# Patient Record
Sex: Female | Born: 1980 | Race: Black or African American | Hispanic: No | Marital: Single | Smoking: Former smoker
Health system: Southern US, Community
[De-identification: ages and names within clinical notes are randomized; demographics above are authoritative.]

## PROBLEM LIST (undated history)

## (undated) ENCOUNTER — Inpatient Hospital Stay (HOSPITAL_COMMUNITY): Payer: Self-pay

## (undated) DIAGNOSIS — T7421XA Adult sexual abuse, confirmed, initial encounter: Secondary | ICD-10-CM

## (undated) DIAGNOSIS — K219 Gastro-esophageal reflux disease without esophagitis: Secondary | ICD-10-CM

## (undated) DIAGNOSIS — M797 Fibromyalgia: Secondary | ICD-10-CM

## (undated) HISTORY — PX: WISDOM TOOTH EXTRACTION: SHX21

## (undated) HISTORY — DX: Adult sexual abuse, confirmed, initial encounter: T74.21XA

---

## 2005-08-12 ENCOUNTER — Emergency Department (HOSPITAL_COMMUNITY): Admission: EM | Admit: 2005-08-12 | Discharge: 2005-08-12 | Payer: Self-pay | Admitting: Emergency Medicine

## 2005-11-17 ENCOUNTER — Emergency Department (HOSPITAL_COMMUNITY): Admission: EM | Admit: 2005-11-17 | Discharge: 2005-11-17 | Payer: Self-pay | Admitting: Family Medicine

## 2009-09-04 ENCOUNTER — Inpatient Hospital Stay (HOSPITAL_COMMUNITY): Admission: AD | Admit: 2009-09-04 | Discharge: 2009-09-04 | Payer: Self-pay | Admitting: Obstetrics and Gynecology

## 2009-09-12 ENCOUNTER — Inpatient Hospital Stay (HOSPITAL_COMMUNITY): Admission: AD | Admit: 2009-09-12 | Discharge: 2009-09-13 | Payer: Self-pay | Admitting: Family Medicine

## 2009-10-03 LAB — CYTOLOGY - PAP: Pap: NEGATIVE

## 2009-12-30 ENCOUNTER — Inpatient Hospital Stay (HOSPITAL_COMMUNITY): Admission: AD | Admit: 2009-12-30 | Discharge: 2009-12-30 | Payer: Self-pay | Admitting: Obstetrics and Gynecology

## 2010-02-01 DIAGNOSIS — IMO0002 Reserved for concepts with insufficient information to code with codable children: Secondary | ICD-10-CM

## 2010-11-23 LAB — URINALYSIS, ROUTINE W REFLEX MICROSCOPIC
Bilirubin Urine: NEGATIVE
Glucose, UA: NEGATIVE mg/dL
Hgb urine dipstick: NEGATIVE
Ketones, ur: 15 mg/dL — AB
Nitrite: NEGATIVE
Protein, ur: NEGATIVE mg/dL
Specific Gravity, Urine: >1.03 — ABNORMAL HIGH (ref 1.005–1.030)
Urobilinogen, UA: 1 mg/dL (ref 0.0–1.0)
pH: 6 (ref 5.0–8.0)

## 2010-11-23 LAB — HEPATITIS B SURFACE ANTIGEN: Hepatitis B Surface Ag: NEGATIVE

## 2010-11-23 LAB — WET PREP, GENITAL
Clue Cells Wet Prep HPF POC: NONE SEEN
Trich, Wet Prep: NONE SEEN
Yeast Wet Prep HPF POC: NONE SEEN

## 2010-11-23 LAB — RAPID URINE DRUG SCREEN, HOSP PERFORMED
Amphetamines: NOT DETECTED
Barbiturates: NOT DETECTED
Benzodiazepines: NOT DETECTED
Cocaine: NOT DETECTED
Opiates: NOT DETECTED
Tetrahydrocannabinol: POSITIVE — AB

## 2010-11-23 LAB — GC/CHLAMYDIA PROBE AMP, GENITAL
Chlamydia, DNA Probe: NEGATIVE
GC Probe Amp, Genital: NEGATIVE

## 2010-11-23 LAB — GC/CHLAMYDIA PROBE AMP, URINE
Chlamydia, Swab/Urine, PCR: NEGATIVE
GC Probe Amp, Urine: NEGATIVE

## 2010-11-23 LAB — CBC
HCT: 31 % — ABNORMAL LOW (ref 36.0–46.0)
Hemoglobin: 10.4 g/dL — ABNORMAL LOW (ref 12.0–15.0)
MCHC: 33.5 g/dL (ref 30.0–36.0)
MCV: 91.7 fL (ref 78.0–100.0)
Platelets: 231 10*3/uL (ref 150–400)
RBC: 3.38 MIL/uL — ABNORMAL LOW (ref 3.87–5.11)
RDW: 14.3 % (ref 11.5–15.5)
WBC: 9.3 10*3/uL (ref 4.0–10.5)

## 2010-11-23 LAB — ABO/RH: ABO/RH(D): A NEG

## 2010-11-23 LAB — TYPE AND SCREEN
ABO/RH(D): A NEG
Antibody Screen: NEGATIVE
Weak D: NEGATIVE

## 2010-11-23 LAB — HIV ANTIBODY (ROUTINE TESTING W REFLEX): HIV: NONREACTIVE

## 2010-11-23 LAB — RPR: RPR Ser Ql: NONREACTIVE

## 2010-11-23 LAB — RUBELLA SCREEN: Rubella: 29.1 IU/mL — ABNORMAL HIGH

## 2010-12-08 LAB — URINALYSIS, ROUTINE W REFLEX MICROSCOPIC
Bilirubin Urine: NEGATIVE
Glucose, UA: 100 mg/dL — AB
Hgb urine dipstick: NEGATIVE
Ketones, ur: 15 mg/dL — AB
Nitrite: NEGATIVE
Protein, ur: NEGATIVE mg/dL
Specific Gravity, Urine: 1.02 (ref 1.005–1.030)
Urobilinogen, UA: 0.2 mg/dL (ref 0.0–1.0)
pH: 6 (ref 5.0–8.0)

## 2010-12-08 LAB — RAPID URINE DRUG SCREEN, HOSP PERFORMED
Amphetamines: NOT DETECTED
Barbiturates: NOT DETECTED
Benzodiazepines: NOT DETECTED
Cocaine: NOT DETECTED
Opiates: NOT DETECTED
Tetrahydrocannabinol: POSITIVE — AB

## 2010-12-08 LAB — GC/CHLAMYDIA PROBE AMP, GENITAL
Chlamydia, DNA Probe: NEGATIVE
GC Probe Amp, Genital: NEGATIVE

## 2010-12-08 LAB — WET PREP, GENITAL
Trich, Wet Prep: NONE SEEN
Yeast Wet Prep HPF POC: NONE SEEN

## 2015-07-17 LAB — OB RESULTS CONSOLE HIV ANTIBODY (ROUTINE TESTING): HIV: NONREACTIVE

## 2015-07-17 LAB — OB RESULTS CONSOLE HEPATITIS B SURFACE ANTIGEN: Hepatitis B Surface Ag: NEGATIVE

## 2015-07-30 ENCOUNTER — Inpatient Hospital Stay (HOSPITAL_COMMUNITY)
Admission: AD | Admit: 2015-07-30 | Discharge: 2015-07-30 | Disposition: A | Discharge status: Home / Self Care | Payer: Medicaid Other | Source: Ambulatory Visit | Attending: Obstetrics and Gynecology | Admitting: Obstetrics and Gynecology

## 2015-07-30 ENCOUNTER — Encounter (HOSPITAL_COMMUNITY): Payer: Self-pay | Admitting: *Deleted

## 2015-07-30 DIAGNOSIS — O2342 Unspecified infection of urinary tract in pregnancy, second trimester: Secondary | ICD-10-CM | POA: Insufficient documentation

## 2015-07-30 DIAGNOSIS — N390 Urinary tract infection, site not specified: Secondary | ICD-10-CM | POA: Diagnosis not present

## 2015-07-30 DIAGNOSIS — Z3A21 21 weeks gestation of pregnancy: Secondary | ICD-10-CM | POA: Insufficient documentation

## 2015-07-30 DIAGNOSIS — Z87891 Personal history of nicotine dependence: Secondary | ICD-10-CM | POA: Diagnosis not present

## 2015-07-30 DIAGNOSIS — M549 Dorsalgia, unspecified: Secondary | ICD-10-CM | POA: Diagnosis present

## 2015-07-30 HISTORY — DX: Fibromyalgia: M79.7

## 2015-07-30 LAB — RAPID URINE DRUG SCREEN, HOSP PERFORMED
Amphetamines: NOT DETECTED
Barbiturates: NOT DETECTED
Benzodiazepines: NOT DETECTED
Cocaine: NOT DETECTED
Opiates: NOT DETECTED
Tetrahydrocannabinol: NOT DETECTED

## 2015-07-30 LAB — CBC
HCT: 30.6 % — ABNORMAL LOW (ref 36.0–46.0)
Hemoglobin: 10.2 g/dL — ABNORMAL LOW (ref 12.0–15.0)
MCH: 30.6 pg (ref 26.0–34.0)
MCHC: 33.3 g/dL (ref 30.0–36.0)
MCV: 91.9 fL (ref 78.0–100.0)
Platelets: 239 10*3/uL (ref 150–400)
RBC: 3.33 MIL/uL — ABNORMAL LOW (ref 3.87–5.11)
RDW: 12.7 % (ref 11.5–15.5)
WBC: 10.7 10*3/uL — ABNORMAL HIGH (ref 4.0–10.5)

## 2015-07-30 LAB — URINE MICROSCOPIC-ADD ON

## 2015-07-30 LAB — COMPREHENSIVE METABOLIC PANEL
ALT: 10 U/L — ABNORMAL LOW (ref 14–54)
AST: 18 U/L (ref 15–41)
Albumin: 2.9 g/dL — ABNORMAL LOW (ref 3.5–5.0)
Alkaline Phosphatase: 63 U/L (ref 38–126)
Anion gap: 10 (ref 5–15)
BUN: 9 mg/dL (ref 6–20)
CO2: 22 mmol/L (ref 22–32)
Calcium: 8.7 mg/dL — ABNORMAL LOW (ref 8.9–10.3)
Chloride: 101 mmol/L (ref 101–111)
Creatinine, Ser: 0.55 mg/dL (ref 0.44–1.00)
GFR calc Af Amer: >60 mL/min (ref >60)
GFR calc non Af Amer: >60 mL/min (ref >60)
Glucose, Bld: 121 mg/dL — ABNORMAL HIGH (ref 65–99)
Potassium: 3.2 mmol/L — ABNORMAL LOW (ref 3.5–5.1)
Sodium: 133 mmol/L — ABNORMAL LOW (ref 135–145)
Total Bilirubin: 0.4 mg/dL (ref 0.3–1.2)
Total Protein: 6.4 g/dL — ABNORMAL LOW (ref 6.5–8.1)

## 2015-07-30 LAB — URINALYSIS, ROUTINE W REFLEX MICROSCOPIC
Bilirubin Urine: NEGATIVE
Glucose, UA: NEGATIVE mg/dL
Ketones, ur: 15 mg/dL — AB
Nitrite: POSITIVE — AB
Protein, ur: NEGATIVE mg/dL
Specific Gravity, Urine: >1.03 — ABNORMAL HIGH (ref 1.005–1.030)
pH: 6 (ref 5.0–8.0)

## 2015-07-30 MED ORDER — LACTATED RINGERS IV BOLUS (SEPSIS)
1000.0000 mL | Freq: Once | INTRAVENOUS | Status: AC
  Administered 2015-07-30: 1000 mL via INTRAVENOUS

## 2015-07-30 MED ORDER — CEFTRIAXONE SODIUM 1 G IJ SOLR
1.0000 g | Freq: Once | INTRAMUSCULAR | Status: AC
  Administered 2015-07-30: 1 g via INTRAMUSCULAR
  Filled 2015-07-30: qty 10

## 2015-07-30 MED ORDER — ONDANSETRON HCL 4 MG/2ML IJ SOLN
4.0000 mg | Freq: Once | INTRAMUSCULAR | Status: AC
Start: 2015-07-30 — End: 2015-07-30
  Administered 2015-07-30: 4 mg via INTRAVENOUS
  Filled 2015-07-30: qty 2

## 2015-07-30 MED ORDER — ACETAMINOPHEN 325 MG PO TABS
650.0000 mg | ORAL_TABLET | Freq: Once | ORAL | Status: AC
  Administered 2015-07-30: 650 mg via ORAL
  Filled 2015-07-30: qty 2
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
## 2015-07-30 NOTE — Discharge Instructions (Signed)
Pregnancy and Urinary Tract Infection  A urinary tract infection (UTI) is a bacterial infection of the urinary tract. Infection of the urinary tract can include the ureters, kidneys (pyelonephritis), bladder (cystitis), and urethra (urethritis). All pregnant women should be screened for bacteria in the urinary tract. Identifying and treating a UTI will decrease the risk of preterm labor and developing more serious infections in both the mother and baby.  CAUSES  Bacteria germs cause almost all UTIs.   RISK FACTORS  Many factors can increase your chances of getting a UTI during pregnancy. These include:  · Having a short urethra.  · Poor toilet and hygiene habits.  · Sexual intercourse.  · Blockage of urine along the urinary tract.  · Problems with the pelvic muscles or nerves.  · Diabetes.  · Obesity.  · Bladder problems after having several children.  · Previous history of UTI.  SIGNS AND SYMPTOMS   · Pain, burning, or a stinging feeling when urinating.  · Suddenly feeling the need to urinate right away (urgency).  · Loss of bladder control (urinary incontinence).  · Frequent urination, more than is common with pregnancy.  · Lower abdominal or back discomfort.  · Cloudy urine.  · Blood in the urine (hematuria).  · Fever.   When the kidneys are infected, the symptoms may be:  · Back pain.  · Flank pain on the right side more so than the left.  · Fever.  · Chills.  · Nausea.  · Vomiting.  DIAGNOSIS   A urinary tract infection is usually diagnosed through urine tests. Additional tests and procedures are sometimes done. These may include:  · Ultrasound exam of the kidneys, ureters, bladder, and urethra.  · Looking in the bladder with a lighted tube (cystoscopy).  TREATMENT  Typically, UTIs can be treated with antibiotic medicines.   HOME CARE INSTRUCTIONS   · Only take over-the-counter or prescription medicines as directed by your health care provider. If you were prescribed antibiotics, take them as directed. Finish  them even if you start to feel better.  · Drink enough fluids to keep your urine clear or pale yellow.  · Do not have sexual intercourse until the infection is gone and your health care provider says it is okay.  · Make sure you are tested for UTIs throughout your pregnancy. These infections often come back.   Preventing a UTI in the Future  · Practice good toilet habits. Always wipe from front to back. Use the tissue only once.  · Do not hold your urine. Empty your bladder as soon as possible when the urge comes.  · Do not douche or use deodorant sprays.  · Wash with soap and warm water around the genital area and the anus.  · Empty your bladder before and after sexual intercourse.  · Wear underwear with a cotton crotch.  · Avoid caffeine and carbonated drinks. They can irritate the bladder.  · Drink cranberry juice or take cranberry pills. This may decrease the risk of getting a UTI.  · Do not drink alcohol.  · Keep all your appointments and tests as scheduled.   SEEK MEDICAL CARE IF:   · Your symptoms get worse.  · You are still having fevers 2 or more days after treatment begins.  · You have a rash.  · You feel that you are having problems with medicines prescribed.  · You have abnormal vaginal discharge.  SEEK IMMEDIATE MEDICAL CARE IF:   · You have back or flank   pain.  · You have chills.  · You have blood in your urine.  · You have nausea and vomiting.  · You have contractions of your uterus.  · You have a gush of fluid from the vagina.  MAKE SURE YOU:  · Understand these instructions.    · Will watch your condition.    · Will get help right away if you are not doing well or get worse.       This information is not intended to replace advice given to you by your health care provider. Make sure you discuss any questions you have with your health care provider.     Document Released: 12/19/2010 Document Revised: 06/14/2013 Document Reviewed: 03/23/2013  Elsevier Interactive Patient Education ©2016 Elsevier  Inc.

## 2015-07-30 NOTE — MAU Note (Addendum)
Hurting real bad, started on Saturday, all across abd and back. Feels like labor pains, on fire, keeps getting worse and worse on going. Vomiting. Found out preg Aug 2

## 2015-07-30 NOTE — MAU Provider Note (Signed)
History     CSN: [id number]  Arrival date and time: 07/30/15 1125   First Provider Initiated Contact with Patient 07/30/15 1222       Chief Complaint  Patient presents with  . Abdominal Pain  . Emesis  . Back Pain   HPI  Jenna Harding is a 34 y.o. [id number] at [redacted]w[redacted]d who presents for abdominal & back pain.  Reports symptoms started Saturday.  Pt is poor historian; difficult to collect HPI.  Reports generalized abdominal & back pain & when asked where it hurts points to all over her body. Also complains of headache.  States she took tylenol yesterday with no relief.  Keeps on mentioning fibromyalgia & asking if I will refer her somewhere.  Vomited twice today; cannot tell me how often this occurs or if this is a new symptom.  Denies diarrhea. Reports some constipation; had normal BM this morning after taking milk of mag.   OB History    Gravida Para Term Preterm AB TAB SAB Ectopic Multiple Living   Past Medical History  Diagnosis Date  . Fibromyalgia     Past Surgical History  Procedure Laterality Date  . No past surgeries      History reviewed. No pertinent family history.  Social History  Substance Use Topics  . Smoking status: Former Smoker    Quit date: 11/08/2014  . Smokeless tobacco: None  . Alcohol Use: None    Allergies: No Known Allergies  No prescriptions prior to admission    Review of Systems  Constitutional: Negative.   Gastrointestinal: Positive for nausea, vomiting, abdominal pain and constipation. Negative for diarrhea.  Genitourinary: Positive for dysuria. Negative for hematuria and flank pain.       No vaginal bleeding  Musculoskeletal: Positive for back pain.  Neurological: Positive for headaches.   Physical Exam   Blood pressure 118/57, pulse 87, temperature 98.5 F (36.9 C), temperature source Oral, resp. rate 20, last menstrual period 01/30/2015.  Physical Exam  Nursing note and vitals  reviewed. Constitutional: She is oriented to person, place, and time. She appears well-developed and well-nourished. She appears distressed.  HENT:  Head: Normocephalic and atraumatic.  Eyes: Conjunctivae are normal. Right eye exhibits no discharge. Left eye exhibits no discharge. No scleral icterus.  Neck: Normal range of motion.  Cardiovascular: Normal rate, regular rhythm and normal heart sounds.   No murmur heard. Respiratory: Effort normal and breath sounds normal. No respiratory distress. She has no wheezes.  GI: Soft. Bowel sounds are normal. She exhibits no distension. There is tenderness (reports generalized tenderness with palpation). There is no rebound, no guarding and no CVA tenderness.  Neurological: She is alert and oriented to person, place, and time.  Skin: Skin is warm and dry. She is not diaphoretic.  Psychiatric: She has a normal mood and affect. Her behavior is normal. Judgment and thought content normal.   Dilation: Closed Effacement (%): Thick (high) Exam by:: Estanislado Spire, NP  FHT 140 per doppler MAU Course  Procedures Results for orders placed or performed during the hospital encounter of 07/30/15 (from the past 24 hour(s))  Urinalysis, Routine w reflex microscopic (not at [facility])     Status: Abnormal   Collection Time: 07/30/15 11:45 AM  Result Value Ref Range   Color, Urine YELLOW YELLOW   APPearance CLEAR CLEAR   Specific Gravity, Urine >1.030 (H) 1.005 - 1.030   pH 6.0  5.0 - 8.0   Glucose, UA NEGATIVE NEGATIVE mg/dL   Hgb urine dipstick SMALL (A) NEGATIVE   Bilirubin Urine NEGATIVE NEGATIVE   Ketones, ur 15 (A) NEGATIVE mg/dL   Protein, ur NEGATIVE NEGATIVE mg/dL   Nitrite POSITIVE (A) NEGATIVE   Leukocytes, UA TRACE (A) NEGATIVE  Urine rapid drug screen (hosp performed)     Status: None   Collection Time: 07/30/15 11:45 AM  Result Value Ref Range   Opiates NONE DETECTED NONE DETECTED   Cocaine NONE DETECTED NONE DETECTED   Benzodiazepines NONE  DETECTED NONE DETECTED   Amphetamines NONE DETECTED NONE DETECTED   Tetrahydrocannabinol NONE DETECTED NONE DETECTED   Barbiturates NONE DETECTED NONE DETECTED  Urine microscopic-add on     Status: Abnormal   Collection Time: 07/30/15 11:45 AM  Result Value Ref Range   Squamous Epithelial / LPF 0-5 (A) NONE SEEN   WBC, UA 6-30 0 - 5 WBC/hpf   RBC / HPF 0-5 0 - 5 RBC/hpf   Bacteria, UA MANY (A) NONE SEEN   Urine-Other MUCOUS PRESENT   CBC     Status: Abnormal   Collection Time: 07/30/15  1:00 PM  Result Value Ref Range   WBC 10.7 (H) 4.0 - 10.5 K/uL   RBC 3.33 (L) 3.87 - 5.11 MIL/uL   Hemoglobin 10.2 (L) 12.0 - 15.0 g/dL   HCT 16.130.6 (L) 09.636.0 - 04.546.0 %   MCV 91.9 78.0 - 100.0 fL   MCH 30.6 26.0 - 34.0 pg   MCHC 33.3 30.0 - 36.0 g/dL   RDW 40.912.7 81.111.5 - 91.415.5 %   Platelets 239 150 - 400 K/uL  Comprehensive metabolic panel     Status: Abnormal   Collection Time: 07/30/15  1:00 PM  Result Value Ref Range   Sodium 133 (L) 135 - 145 mmol/L   Potassium 3.2 (L) 3.5 - 5.1 mmol/L   Chloride 101 101 - 111 mmol/L   CO2 22 22 - 32 mmol/L   Glucose, Bld 121 (H) 65 - 99 mg/dL   BUN 9 6 - 20 mg/dL   Creatinine, Ser 7.820.55 0.44 - 1.00 mg/dL   Calcium 8.7 (L) 8.9 - 10.3 mg/dL   Total Protein 6.4 (L) 6.5 - 8.1 g/dL   Albumin 2.9 (L) 3.5 - 5.0 g/dL   AST 18 15 - 41 U/L   ALT 10 (L) 14 - 54 U/L   Alkaline Phosphatase 63 38 - 126 U/L   Total Bilirubin 0.4 0.3 - 1.2 mg/dL   GFR calc non Af Amer >60 >60 mL/min   GFR calc Af Amer >60 >60 mL/min   Anion gap 10 5 - 15    MDM IV fluid bolus, IV zofran, & PO tylenol Pt not observed vomiting while in MAU FHT 140 by doppler Per Dr. Ellyn HackBovard, pt has previously diagnosed UTI that is susceptible to septra; doesn't think pt took antibiotics. Discussed with Dr. Ellyn HackBovard that patient states she cannot pay for her medications; will give patient rocephin IM in MAU.    Assessment and Plan  A:  1. Urinary tract infection without hematuria, site unspecified     P: Plan to discharge patient; when I went to pt's room to discuss POC, pt was not there and had pulled out IV. Left unit without informing staff.   Judeth HornErin Myia Bergh, NP   07/30/2015, 12:21 PM

## 2015-08-01 LAB — CULTURE, OB URINE: Culture: >=100000

## 2015-09-08 NOTE — L&D Delivery Note (Signed)
Delivery Note  Patient presented with PROM approximately 4/4 some time in the morning. Total ROM > 24 hours. Augmented with pitocin. AROM clear shortly prior to 2nd stage.  At 1:19 PM a viable female was delivered via  (Presentation: OA).  APGAR: 4/9 ; weight 4095 g.   Placenta status: intact.  Cord: 3-vessel  with the following complications: 2-minute shoulder dystocia. Head slow to deliver and assistance requested. Loose nuchal noted. Anterior shoulder did not deliver with firm downward traction. Rotational maneuver attempted, not successful. Posterior arm delivered as patient was transferred to hand-and-knees. Remainder of delivery uncomplicated. Infant evaluated by NICU staff, normal exam thus far.  Cord pH: obtained, but at time of signing this note result not in baby's chart - appears there was an error in sending or processing.  Anesthesia:  Fentanyl Episiotomy:  none Lacerations:  none Est. Blood Loss (mL):  324  Mom to postpartum.  Baby to Couplet care / Skin to Skin.  Cherrie Gauzeoah B Jessicia Napolitano 12/11/2015, 1:33 PM

## 2015-09-27 ENCOUNTER — Encounter (HOSPITAL_COMMUNITY): Payer: Self-pay | Admitting: *Deleted

## 2015-09-27 ENCOUNTER — Observation Stay (HOSPITAL_COMMUNITY)
Admission: EM | Admit: 2015-09-27 | Discharge: 2015-09-28 | Disposition: A | Discharge status: Other Acute Inpatient Hospital | Payer: Medicaid Other | Attending: Obstetrics and Gynecology | Admitting: Obstetrics and Gynecology

## 2015-09-27 DIAGNOSIS — R1084 Generalized abdominal pain: Principal | ICD-10-CM | POA: Insufficient documentation

## 2015-09-27 DIAGNOSIS — Z3A29 29 weeks gestation of pregnancy: Secondary | ICD-10-CM | POA: Insufficient documentation

## 2015-09-27 DIAGNOSIS — O9989 Other specified diseases and conditions complicating pregnancy, childbirth and the puerperium: Secondary | ICD-10-CM | POA: Insufficient documentation

## 2015-09-27 DIAGNOSIS — O4590 Premature separation of placenta, unspecified, unspecified trimester: Secondary | ICD-10-CM

## 2015-09-27 DIAGNOSIS — Z3490 Encounter for supervision of normal pregnancy, unspecified, unspecified trimester: Secondary | ICD-10-CM

## 2015-09-27 DIAGNOSIS — S3991XA Unspecified injury of abdomen, initial encounter: Secondary | ICD-10-CM

## 2015-09-27 LAB — CBC WITH DIFFERENTIAL/PLATELET
Basophils Absolute: 0 10*3/uL (ref 0.0–0.1)
Basophils Relative: 0 %
Eosinophils Absolute: 0.1 10*3/uL (ref 0.0–0.7)
Eosinophils Relative: 1 %
HCT: 30.9 % — ABNORMAL LOW (ref 36.0–46.0)
Hemoglobin: 9.9 g/dL — ABNORMAL LOW (ref 12.0–15.0)
Lymphocytes Relative: 28 %
Lymphs Abs: 2.1 10*3/uL (ref 0.7–4.0)
MCH: 29.6 pg (ref 26.0–34.0)
MCHC: 32 g/dL (ref 30.0–36.0)
MCV: 92.2 fL (ref 78.0–100.0)
Monocytes Absolute: 0.4 10*3/uL (ref 0.1–1.0)
Monocytes Relative: 6 %
Neutro Abs: 5.1 10*3/uL (ref 1.7–7.7)
Neutrophils Relative %: 65 %
Platelets: 231 10*3/uL (ref 150–400)
RBC: 3.35 MIL/uL — ABNORMAL LOW (ref 3.87–5.11)
RDW: 13.4 % (ref 11.5–15.5)
WBC: 7.8 10*3/uL (ref 4.0–10.5)

## 2015-09-27 MED ORDER — LACTATED RINGERS IV SOLN
INTRAVENOUS | Status: DC
  Administered 2015-09-27: 23:00:00 via INTRAVENOUS

## 2015-09-27 NOTE — ED Notes (Signed)
Rapid OB at bedside 
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
## 2015-09-27 NOTE — ED Notes (Signed)
Rapid OB response nurse called

## 2015-09-28 ENCOUNTER — Observation Stay (HOSPITAL_COMMUNITY): Payer: Medicaid Other

## 2015-09-28 DIAGNOSIS — Z3A29 29 weeks gestation of pregnancy: Secondary | ICD-10-CM

## 2015-09-28 DIAGNOSIS — O9A313 Physical abuse complicating pregnancy, third trimester: Secondary | ICD-10-CM | POA: Diagnosis not present

## 2015-09-28 DIAGNOSIS — S3991XA Unspecified injury of abdomen, initial encounter: Secondary | ICD-10-CM

## 2015-09-28 LAB — BASIC METABOLIC PANEL
Anion gap: 9 (ref 5–15)
BUN: 10 mg/dL (ref 6–20)
CO2: 22 mmol/L (ref 22–32)
Calcium: 8.8 mg/dL — ABNORMAL LOW (ref 8.9–10.3)
Chloride: 109 mmol/L (ref 101–111)
Creatinine, Ser: 0.53 mg/dL (ref 0.44–1.00)
GFR calc Af Amer: >60 mL/min (ref >60)
GFR calc non Af Amer: >60 mL/min (ref >60)
Glucose, Bld: 114 mg/dL — ABNORMAL HIGH (ref 65–99)
Potassium: 3.4 mmol/L — ABNORMAL LOW (ref 3.5–5.1)
Sodium: 140 mmol/L (ref 135–145)

## 2015-09-28 LAB — URINALYSIS, ROUTINE W REFLEX MICROSCOPIC
Bilirubin Urine: NEGATIVE
Glucose, UA: 100 mg/dL — AB
Hgb urine dipstick: NEGATIVE
Ketones, ur: NEGATIVE mg/dL
Nitrite: NEGATIVE
Protein, ur: NEGATIVE mg/dL
Specific Gravity, Urine: 1.025 (ref 1.005–1.030)
pH: 6 (ref 5.0–8.0)

## 2015-09-28 LAB — URINE MICROSCOPIC-ADD ON

## 2015-09-28 LAB — TYPE AND SCREEN
ABO/RH(D): A NEG
Antibody Screen: NEGATIVE

## 2015-09-28 LAB — HCG, QUANTITATIVE, PREGNANCY: hCG, Beta Chain, Quant, S: 6932 m[IU]/mL — ABNORMAL HIGH (ref <5)

## 2015-09-28 LAB — RAPID URINE DRUG SCREEN, HOSP PERFORMED
Amphetamines: NOT DETECTED
Barbiturates: NOT DETECTED
Benzodiazepines: NOT DETECTED
Cocaine: NOT DETECTED
Opiates: NOT DETECTED
Tetrahydrocannabinol: POSITIVE — AB

## 2015-09-28 LAB — GLUCOSE TOLERANCE, 1 HOUR: Glucose, 1 Hour GTT: 123 mg/dL (ref 70–140)

## 2015-09-28 LAB — KLEIHAUER-BETKE STAIN
# Vials RhIg: 1
Fetal Cells %: 0 %
Quantitation Fetal Hemoglobin: 0 mL

## 2015-09-28 MED ORDER — CYCLOBENZAPRINE HCL 10 MG PO TABS: 10.0000 mg | ORAL_TABLET | Freq: Three times a day (TID) | ORAL | Status: DC | PRN

## 2015-09-28 MED ORDER — ACETAMINOPHEN 500 MG PO TABS
1000.0000 mg | ORAL_TABLET | Freq: Four times a day (QID) | ORAL | Status: DC | PRN
Start: 2015-09-28 — End: 2015-09-28
  Administered 2015-09-28: 1000 mg via ORAL
  Filled 2015-09-28: qty 2

## 2015-09-28 MED ORDER — PRENATAL MULTIVITAMIN CH
1.0000 | ORAL_TABLET | Freq: Every day | ORAL | Status: DC
  Administered 2015-09-28: 1 via ORAL
  Filled 2015-09-28: qty 1

## 2015-09-28 MED ORDER — RHO D IMMUNE GLOBULIN 1500 UNIT/2ML IJ SOSY
300.0000 ug | PREFILLED_SYRINGE | Freq: Once | INTRAMUSCULAR | Status: DC
  Filled 2015-09-28: qty 2

## 2015-09-28 MED ORDER — ACETAMINOPHEN 325 MG PO TABS
650.0000 mg | ORAL_TABLET | ORAL | Status: DC | PRN
  Administered 2015-09-28: 650 mg via ORAL
  Filled 2015-09-28: qty 2

## 2015-09-28 MED ORDER — CYCLOBENZAPRINE HCL 10 MG PO TABS
10.0000 mg | ORAL_TABLET | Freq: Three times a day (TID) | ORAL | Status: DC | PRN
  Administered 2015-09-28: 10 mg via ORAL
  Filled 2015-09-28: qty 1

## 2015-09-28 MED ORDER — LACTATED RINGERS IV SOLN
INTRAVENOUS | Status: DC
  Administered 2015-09-28: 03:00:00 via INTRAVENOUS

## 2015-09-28 MED ORDER — DOCUSATE SODIUM 100 MG PO CAPS
100.0000 mg | ORAL_CAPSULE | Freq: Every day | ORAL | Status: DC
  Administered 2015-09-28: 100 mg via ORAL
  Filled 2015-09-28: qty 1

## 2015-09-28 MED ORDER — CALCIUM CARBONATE ANTACID 500 MG PO CHEW: 2.0000 | CHEWABLE_TABLET | ORAL | Status: DC | PRN

## 2015-09-28 NOTE — Discharge Instructions (Signed)
What Do I Need to Know About Injuries During Pregnancy? °Trauma is the most common cause of injury and death in pregnant women. This can also result in significant harm or death of the baby. °Your baby is protected in the womb (uterus) by a sac filled with fluid (amniotic sac). Your baby can be harmed if there is direct, high-impact trauma to your abdomen and pelvis. This type of trauma can result in tearing of your uterus, the placenta pulling away from the wall of the uterus (placenta abruption), or the amniotic sac breaking open (rupture of membranes). These injuries can decrease or stop the blood supply to your baby or cause you to go into labor earlier than expected. Minor falls and low-impact automobile accidents do not usually harm your baby, even if they do minimally harm you. °WHAT KIND OF INJURIES CAN AFFECT MY PREGNANCY? °The most common causes of injury or death to a baby include: °· Falls. Falls are more common in the second and third trimester of the pregnancy. Factors that increase your risk of falling include: °¨ Increase in your weight. °¨ The change in your center of gravity. °¨ Tripping over an object that cannot be seen. °¨ Increased looseness (laxity) of your ligaments resulting in less coordinated movements (you may feel clumsy). °¨ Falling during high-risk activities like horseback riding or skiing. °· Automobile accidents. It is important to wear your seat belt properly, with the lap belt below your abdomen, and always practice safe driving. °· Domestic violence or assault. °· Burns (fire or electrical). °The most common causes of injury or death to the pregnant woman include: °· Injuries that cause severe bleeding, shock, and loss of blood flow to major organs. °· Head and neck injuries that result in severe brain or spinal damage. °· Chest trauma that can cause direct injury to the heart and lungs or any injury that affects the area enclosed by the ribs. Trauma to this area can result in  cardiorespiratory arrest. °WHAT CAN I DO TO PROTECT MYSELF AND MY BABY FROM INJURY WHILE I AM PREGNANT? °· Remove slippery rugs and loose objects on the floor that increase your risk of tripping. °· Avoid walking on wet or slippery floors. °· Wear comfortable shoes that have a good grip on the sole. Do not wear high-heeled shoes. °· Always wear your seat belt properly, with the lap belt below your abdomen, and always practice safe driving. Do not ride on a motorcycle while pregnant. °· Do not participate in high-impact activities or sports. °· Avoid fires, starting fires, lifting heavy pots of boiling or hot liquids, and fixing electrical problems. °· Only take over-the-counter or prescription medicines for pain, fever, or discomfort as directed by your health care provider. °· Know your blood type and the father's blood type in case you develop vaginal bleeding or experience an injury for which a blood transfusion may be necessary. °· Call your local emergency services (911 in the [place] if you are a victim of domestic violence or assault. Spousal abuse can be a significant cause of trauma during pregnancy. For help and support, contact the National Domestic Violence Hotline. °WHEN SHOULD I SEEK IMMEDIATE MEDICAL CARE?  °· You fall on your abdomen or experience any high-force accident or injury. °· You have been assaulted (domestic or otherwise). °· You have been in a car accident. °· You develop vaginal bleeding. °· You develop fluid leaking from the vagina. °· You develop uterine contractions (pelvic cramping, pain, or significant low back   pain). °· You become weak or faint, or have uncontrolled vomiting after trauma. °· You had a serious burn. This includes burns to the face, neck, hands, or genitals, or burns greater than the size of your palm anywhere else. °· You develop neck stiffness or pain after a fall or from other trauma. °· You develop a headache or vision problems after a fall or from other  trauma. °· You do not feel the baby moving or the baby is not moving as much as before a fall or other trauma. °  °This information is not intended to replace advice given to you by your health care provider. Make sure you discuss any questions you have with your health care provider. °  °Document Released: 10/01/2004 Document Revised: 09/14/2014 Document Reviewed: 05/31/2013 °Elsevier Interactive Patient Education ©2016 Elsevier Inc. ° °

## 2015-09-28 NOTE — Progress Notes (Signed)
Pt requested RN leave room secondary having a "important phone call to make before leaving."  RN noticed EFM removed by pt when pt up to bathroom @ 1542.  Attempted to review discharge instructions with pt, pt refused.  Rx reviewed and given to pt.

## 2015-09-28 NOTE — Progress Notes (Signed)
[facility] on the way to pick up patient at this time; Report called to [place], RN on Antenatal unit by Lucas Mallow, RN RR[facility]

## 2015-09-28 NOTE — Progress Notes (Addendum)
Report received & care assumed by this RN. 
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
## 2015-10-06 ENCOUNTER — Encounter (HOSPITAL_COMMUNITY): Payer: Self-pay | Admitting: *Deleted

## 2015-10-06 ENCOUNTER — Inpatient Hospital Stay (HOSPITAL_COMMUNITY)
Admission: AD | Admit: 2015-10-06 | Discharge: 2015-10-07 | Disposition: A | Discharge status: Home / Self Care | Payer: Medicaid Other | Source: Ambulatory Visit | Attending: Obstetrics & Gynecology | Admitting: Obstetrics & Gynecology

## 2015-10-06 DIAGNOSIS — Z3A30 30 weeks gestation of pregnancy: Secondary | ICD-10-CM

## 2015-10-06 DIAGNOSIS — Z87891 Personal history of nicotine dependence: Secondary | ICD-10-CM

## 2015-10-06 DIAGNOSIS — O212 Late vomiting of pregnancy: Secondary | ICD-10-CM

## 2015-10-06 DIAGNOSIS — G4489 Other headache syndrome: Secondary | ICD-10-CM | POA: Diagnosis not present

## 2015-10-06 DIAGNOSIS — W108XXA Fall (on) (from) other stairs and steps, initial encounter: Secondary | ICD-10-CM | POA: Insufficient documentation

## 2015-10-06 DIAGNOSIS — O26893 Other specified pregnancy related conditions, third trimester: Secondary | ICD-10-CM

## 2015-10-06 DIAGNOSIS — R51 Headache: Secondary | ICD-10-CM | POA: Diagnosis present

## 2015-10-06 DIAGNOSIS — O9989 Other specified diseases and conditions complicating pregnancy, childbirth and the puerperium: Secondary | ICD-10-CM | POA: Diagnosis not present

## 2015-10-06 LAB — URINALYSIS, ROUTINE W REFLEX MICROSCOPIC
Bilirubin Urine: NEGATIVE
Glucose, UA: NEGATIVE mg/dL
Hgb urine dipstick: NEGATIVE
Ketones, ur: 15 mg/dL — AB
Leukocytes, UA: NEGATIVE
Nitrite: NEGATIVE
Protein, ur: NEGATIVE mg/dL
Specific Gravity, Urine: 1.025 (ref 1.005–1.030)
pH: 6 (ref 5.0–8.0)
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
## 2015-10-06 NOTE — MAU Note (Signed)
Pt states that a few hours ago she felt fatigued and weak and having some abdominal pain. States that she got really dizzy and tripped down the stairs on the way here and hit her left side. Denies vag bleeding or LOF. Has white discharge. +FM

## 2015-10-07 ENCOUNTER — Inpatient Hospital Stay (HOSPITAL_COMMUNITY)
Admission: AD | Admit: 2015-10-07 | Discharge: 2015-10-07 | Disposition: A | Discharge status: Home / Self Care | Payer: Medicaid Other | Source: Ambulatory Visit | Attending: Family Medicine | Admitting: Family Medicine

## 2015-10-07 ENCOUNTER — Encounter (HOSPITAL_COMMUNITY): Payer: Self-pay

## 2015-10-07 DIAGNOSIS — G4489 Other headache syndrome: Secondary | ICD-10-CM | POA: Diagnosis not present

## 2015-10-07 DIAGNOSIS — R51 Headache: Secondary | ICD-10-CM | POA: Insufficient documentation

## 2015-10-07 DIAGNOSIS — O9989 Other specified diseases and conditions complicating pregnancy, childbirth and the puerperium: Secondary | ICD-10-CM | POA: Diagnosis not present

## 2015-10-07 DIAGNOSIS — Z87891 Personal history of nicotine dependence: Secondary | ICD-10-CM | POA: Insufficient documentation

## 2015-10-07 DIAGNOSIS — Z3A30 30 weeks gestation of pregnancy: Secondary | ICD-10-CM | POA: Insufficient documentation

## 2015-10-07 DIAGNOSIS — O26893 Other specified pregnancy related conditions, third trimester: Secondary | ICD-10-CM | POA: Insufficient documentation

## 2015-10-07 HISTORY — DX: Gastro-esophageal reflux disease without esophagitis: K21.9

## 2015-10-07 LAB — CBC
HCT: 28.9 % — ABNORMAL LOW (ref 36.0–46.0)
Hemoglobin: 9.6 g/dL — ABNORMAL LOW (ref 12.0–15.0)
MCH: 29.2 pg (ref 26.0–34.0)
MCHC: 33.2 g/dL (ref 30.0–36.0)
MCV: 87.8 fL (ref 78.0–100.0)
Platelets: 245 10*3/uL (ref 150–400)
RBC: 3.29 MIL/uL — ABNORMAL LOW (ref 3.87–5.11)
RDW: 13.4 % (ref 11.5–15.5)
WBC: 12.1 10*3/uL — ABNORMAL HIGH (ref 4.0–10.5)

## 2015-10-07 MED ORDER — BETAMETHASONE SOD PHOS & ACET 6 (3-3) MG/ML IJ SUSP
12.0000 mg | Freq: Once | INTRAMUSCULAR | Status: AC
Start: 2015-10-07 — End: 2015-10-07
  Administered 2015-10-07: 12 mg via INTRAMUSCULAR
  Filled 2015-10-07: qty 2

## 2015-10-07 MED ORDER — DICYCLOMINE HCL 10 MG PO CAPS
10.0000 mg | ORAL_CAPSULE | Freq: Once | ORAL | Status: AC
  Administered 2015-10-07: 10 mg via ORAL
  Filled 2015-10-07: qty 1

## 2015-10-07 MED ORDER — ACETAMINOPHEN-CODEINE #3 300-30 MG PO TABS
2.0000 | ORAL_TABLET | ORAL | Status: DC | PRN
  Administered 2015-10-07: 2 via ORAL
  Filled 2015-10-07: qty 2

## 2015-10-07 MED ORDER — BETAMETHASONE SOD PHOS & ACET 6 (3-3) MG/ML IJ SUSP
12.0000 mg | Freq: Once | INTRAMUSCULAR | Status: DC
  Filled 2015-10-07: qty 2

## 2015-10-07 NOTE — MAU Note (Addendum)
Was here last night, fell going down steps- hit stomach. abd pain wherever the baby moves.  Increased pressure.  When stood up in lobby- had a sharp pain in cervix- can't walk when it hits.  Has been dizzy at times.  Is wanting steroid shot in case she delivers early, was 3 cm last night- afraid she may deliver early.  Having same HA, took some Tylenol last night- helped some; hasn't take anything today.  Thinks she has bv

## 2015-10-07 NOTE — MAU Note (Signed)
Encouraged pt to stay and receive steroid injection.  Pt stated she was too uncomfortable and needed to go home since she couldn't get anything for pain here.

## 2015-10-07 NOTE — MAU Note (Signed)
Pt asking for pain med, no medication orders given at this time.   Suggested steroid injejctions, no order given at this time.

## 2015-10-07 NOTE — MAU Note (Signed)
Urine sent to lab 
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
## 2015-10-07 NOTE — MAU Provider Note (Signed)
History   [id number] @ 30.6 wks in with c/o headache that she woke up with this afternoon and wants to now get her BMX shot that  She did not stay for last night when she left AMA.  CSN: [id number]  Arrival date & time 10/07/15  1601   None     Chief Complaint  Patient presents with  . Abdominal Pain  . Headache  . Dizziness    HPI  Past Medical History  Diagnosis Date  . Fibromyalgia     Past Surgical History  Procedure Laterality Date  . No past surgeries      No family history on file.  Social History  Substance Use Topics  . Smoking status: Former Smoker    Quit date: 11/08/2014  . Smokeless tobacco: Not on file  . Alcohol Use: No    OB History    Gravida Para Term Preterm AB TAB SAB Ectopic Multiple Living   Review of Systems  Constitutional: Negative.   Eyes: Negative.   Respiratory: Negative.   Cardiovascular: Negative.   Gastrointestinal: Negative.   Endocrine: Negative.   Genitourinary: Negative.   Musculoskeletal: Negative.   Skin: Negative.   Allergic/Immunologic: Negative.   Neurological: Positive for light-headedness and headaches.  Hematological: Negative.   Psychiatric/Behavioral: Negative.     Allergies  Iron and Percocet  Home Medications  No current outpatient prescriptions on file.  BP 117/46 mmHg  Pulse 88  Temp(Src) 98.1 F (36.7 C) (Oral)  Resp 18  SpO2 100%  LMP 01/30/2015  Physical Exam  Constitutional: She is oriented to person, place, and time. She appears well-developed and well-nourished.  HENT:  Head: Normocephalic.  Eyes: Pupils are equal, round, and reactive to light.  Neck: Normal range of motion.  Cardiovascular: Normal rate, regular rhythm, normal heart sounds and intact distal pulses.   Pulmonary/Chest: Effort normal and breath sounds normal.  Abdominal: Soft. Bowel sounds are normal.  Genitourinary: Vagina normal and uterus normal.  Musculoskeletal: Normal range of motion.   Neurological: She is alert and oriented to person, place, and time. She has normal reflexes.  Skin: Skin is warm and dry.  Psychiatric: She has a normal mood and affect. Her behavior is normal. Judgment and thought content normal.    MAU Course  Procedures (including critical care time)  Labs Reviewed - No data to display No results found.   No diagnosis found.    MDM  SVE 2-3/th/post/high. Will give bmx and repeat in 24 hrs. Pain relieved will d/c home pt is to come tomorrow at 1700 for 2nd dose of BMX

## 2015-10-08 ENCOUNTER — Inpatient Hospital Stay (HOSPITAL_COMMUNITY)
Admission: AD | Admit: 2015-10-08 | Discharge: 2015-10-08 | Disposition: A | Discharge status: Home / Self Care | Payer: Medicaid Other | Source: Ambulatory Visit | Attending: Obstetrics & Gynecology | Admitting: Obstetrics & Gynecology

## 2015-10-08 MED ORDER — BETAMETHASONE SOD PHOS & ACET 6 (3-3) MG/ML IJ SUSP
12.0000 mg | Freq: Once | INTRAMUSCULAR | Status: AC
  Administered 2015-10-08: 12 mg via INTRAMUSCULAR
  Filled 2015-10-08: qty 2

## 2015-10-11 ENCOUNTER — Encounter (HOSPITAL_COMMUNITY): Payer: Self-pay | Admitting: *Deleted

## 2015-10-11 ENCOUNTER — Inpatient Hospital Stay (HOSPITAL_COMMUNITY)
Admission: AD | Admit: 2015-10-11 | Discharge: 2015-10-11 | Disposition: A | Discharge status: Home / Self Care | Payer: Medicaid Other | Source: Ambulatory Visit | Attending: Obstetrics & Gynecology | Admitting: Obstetrics & Gynecology

## 2015-10-11 DIAGNOSIS — Z3A31 31 weeks gestation of pregnancy: Secondary | ICD-10-CM | POA: Insufficient documentation

## 2015-10-11 DIAGNOSIS — Z87891 Personal history of nicotine dependence: Secondary | ICD-10-CM | POA: Insufficient documentation

## 2015-10-11 DIAGNOSIS — B9689 Other specified bacterial agents as the cause of diseases classified elsewhere: Secondary | ICD-10-CM

## 2015-10-11 DIAGNOSIS — O23593 Infection of other part of genital tract in pregnancy, third trimester: Secondary | ICD-10-CM | POA: Diagnosis not present

## 2015-10-11 DIAGNOSIS — O26899 Other specified pregnancy related conditions, unspecified trimester: Secondary | ICD-10-CM

## 2015-10-11 DIAGNOSIS — N760 Acute vaginitis: Secondary | ICD-10-CM | POA: Insufficient documentation

## 2015-10-11 DIAGNOSIS — R102 Pelvic and perineal pain: Secondary | ICD-10-CM | POA: Insufficient documentation

## 2015-10-11 DIAGNOSIS — A499 Bacterial infection, unspecified: Secondary | ICD-10-CM

## 2015-10-11 DIAGNOSIS — R109 Unspecified abdominal pain: Secondary | ICD-10-CM | POA: Diagnosis present

## 2015-10-11 LAB — URINALYSIS, ROUTINE W REFLEX MICROSCOPIC
Bilirubin Urine: NEGATIVE
Glucose, UA: NEGATIVE mg/dL
Hgb urine dipstick: NEGATIVE
Ketones, ur: 15 mg/dL — AB
Leukocytes, UA: NEGATIVE
Nitrite: NEGATIVE
Protein, ur: NEGATIVE mg/dL
Specific Gravity, Urine: >1.03 — ABNORMAL HIGH (ref 1.005–1.030)
pH: 5.5 (ref 5.0–8.0)

## 2015-10-11 LAB — WET PREP, GENITAL
Clue Cells Wet Prep HPF POC: NONE SEEN
Sperm: NONE SEEN
Trich, Wet Prep: NONE SEEN
Yeast Wet Prep HPF POC: NONE SEEN

## 2015-10-11 MED ORDER — METRONIDAZOLE 500 MG PO TABS: 500.0000 mg | ORAL_TABLET | Freq: Two times a day (BID) | ORAL | Status: DC
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
## 2015-10-11 NOTE — Discharge Instructions (Signed)

## 2015-10-11 NOTE — MAU Provider Note (Signed)
History     CSN: [id number]  Arrival date and time: 10/11/15 4098   First Provider Initiated Contact with Patient 10/11/15 2022      Chief Complaint  Patient presents with  . Abdominal Pain   HPI  Jenna Harding 35 y.o. [id number]  presents to the MAU stating that she thinks she has a vaginal infection and has been having a shooting pain in abdomen for the past 3 days. She has receives a round of steroids. She denies vaginal bleeding, LOF, contractions. Reports positive fetal movement.  Past Medical History  Diagnosis Date  . Fibromyalgia   . GERD (gastroesophageal reflux disease)     Past Surgical History  Procedure Laterality Date  . No past surgeries      No family history on file.  Social History  Substance Use Topics  . Smoking status: Former Smoker    Quit date: 11/08/2014  . Smokeless tobacco: Not on file  . Alcohol Use: No    Allergies:  Allergies  Allergen Reactions  . Iron Diarrhea    Irritates gastritis  . Percocet [Oxycodone-Acetaminophen] Itching    Prescriptions prior to admission  Medication Sig Dispense Refill Last Dose  . acetaminophen (TYLENOL) 325 MG tablet Take 650 mg by mouth every 6 (six) hours as needed for moderate pain.   Past Week at Unknown time  . cyclobenzaprine (FLEXERIL) 10 MG tablet Take 1 tablet (10 mg total) by mouth 3 (three) times daily as needed for muscle spasms. 30 tablet 2 Past Week at Unknown time    Review of Systems  Constitutional: Negative for fever.  Gastrointestinal: Positive for abdominal pain.  Genitourinary:       Vaginal discharge and odor  All other systems reviewed and are negative.  Physical Exam   Blood pressure 118/58, pulse 90, resp. rate 20, height  (1.727 m), weight 90.175 kg (198 lb 12.8 oz), last menstrual period 01/30/2015.  Physical Exam  Nursing note and vitals reviewed. Constitutional: She is oriented to person, place, and time. She appears well-developed and well-nourished.  HENT:   Head: Normocephalic and atraumatic.  Cardiovascular: Normal rate.   Respiratory: Effort normal and breath sounds normal. No respiratory distress.  GI: Soft. There is no tenderness.  Genitourinary: Vagina normal.  Musculoskeletal: Normal range of motion.  Neurological: She is alert and oriented to person, place, and time.  Skin: Skin is warm and dry.  Psychiatric: She has a normal mood and affect. Her behavior is normal. Judgment and thought content normal.   Results for orders placed or performed during the hospital encounter of 10/11/15 (from the past 24 hour(s))  Urinalysis, Routine w reflex microscopic (not at [facility])     Status: Abnormal   Collection Time: 10/11/15  7:28 PM  Result Value Ref Range   Color, Urine YELLOW YELLOW   APPearance CLEAR CLEAR   Specific Gravity, Urine >1.030 (H) 1.005 - 1.030   pH 5.5 5.0 - 8.0   Glucose, UA NEGATIVE NEGATIVE mg/dL   Hgb urine dipstick NEGATIVE NEGATIVE   Bilirubin Urine NEGATIVE NEGATIVE   Ketones, ur 15 (A) NEGATIVE mg/dL   Protein, ur NEGATIVE NEGATIVE mg/dL   Nitrite NEGATIVE NEGATIVE   Leukocytes, UA NEGATIVE NEGATIVE  Wet prep, genital     Status: Abnormal   Collection Time: 10/11/15  8:33 PM  Result Value Ref Range   Yeast Wet Prep HPF POC NONE SEEN NONE SEEN   Trich, Wet Prep NONE SEEN NONE SEEN   Clue Cells Wet  Prep HPF POC NONE SEEN NONE SEEN   WBC, Wet Prep HPF POC MODERATE (A) NONE SEEN   Sperm NONE SEEN   Dilation: 1.5 Effacement (%): Thick Presentation: Vertex Exam by:: Illene Bolus, CNM  MAU Course  Procedures  MDM Pending wet prep; Urinalysis shows 15 ketones. Pt chooses to PO hydrate. Will treat with flagyl bacause pt is convinced she has bacterial vaginitis. She does have amine odor. FHR CAt 1 with no contractions  Assessment and Plan  Bacterial vagintitis Pelvic Pain in Pregnancy Flagyl Discharge   Clemmons,Lori Grissett 10/11/2015, 8:42 PM

## 2015-10-11 NOTE — Progress Notes (Signed)
Lori Clemmons CNM in to see pt and discuss d/c plan. Written and verbal d/c instructions given and understanding voiced. 
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
## 2015-10-15 ENCOUNTER — Encounter: Payer: Medicaid Other | Admitting: Advanced Practice Midwife

## 2015-10-15 ENCOUNTER — Telehealth: Payer: Self-pay | Admitting: Family Medicine

## 2015-10-15 NOTE — Telephone Encounter (Addendum)
Patient called on day of appointment to reschedule appointment, due to lack of transportation. Patient said that she has started process with medicaid transportation but has not been approved at this time. Patient has requested to reschedule appointment until November 04, 2015, saying that she will have money to pay for transportation at that time. Stressed importance of trying to come in as soon as possible for appointment and to call if medicaid transporation is approved to reschedule appointment to sooner date.

## 2015-11-04 ENCOUNTER — Ambulatory Visit (INDEPENDENT_AMBULATORY_CARE_PROVIDER_SITE_OTHER): Payer: Medicaid Other | Admitting: Obstetrics and Gynecology

## 2015-11-04 ENCOUNTER — Encounter: Payer: Self-pay | Admitting: Obstetrics and Gynecology

## 2015-11-04 VITALS — BP 116/78 | HR 89 | Temp 98.2°F | Wt 207.5 lb

## 2015-11-04 DIAGNOSIS — O09213 Supervision of pregnancy with history of pre-term labor, third trimester: Secondary | ICD-10-CM

## 2015-11-04 DIAGNOSIS — O360930 Maternal care for other rhesus isoimmunization, third trimester, not applicable or unspecified: Secondary | ICD-10-CM | POA: Diagnosis not present

## 2015-11-04 DIAGNOSIS — O0990 Supervision of high risk pregnancy, unspecified, unspecified trimester: Secondary | ICD-10-CM

## 2015-11-04 DIAGNOSIS — O99013 Anemia complicating pregnancy, third trimester: Secondary | ICD-10-CM | POA: Diagnosis not present

## 2015-11-04 DIAGNOSIS — Z6791 Unspecified blood type, Rh negative: Secondary | ICD-10-CM

## 2015-11-04 DIAGNOSIS — D573 Sickle-cell trait: Secondary | ICD-10-CM

## 2015-11-04 DIAGNOSIS — O26899 Other specified pregnancy related conditions, unspecified trimester: Secondary | ICD-10-CM

## 2015-11-04 DIAGNOSIS — R102 Pelvic and perineal pain: Secondary | ICD-10-CM

## 2015-11-04 DIAGNOSIS — Z8751 Personal history of pre-term labor: Secondary | ICD-10-CM

## 2015-11-04 LAB — POCT URINALYSIS DIP (DEVICE)
Bilirubin Urine: NEGATIVE
Glucose, UA: >=1000 mg/dL — AB
Hgb urine dipstick: NEGATIVE
Leukocytes, UA: NEGATIVE
Nitrite: NEGATIVE
Protein, ur: NEGATIVE mg/dL
Specific Gravity, Urine: 1.025 (ref 1.005–1.030)
Urobilinogen, UA: 0.2 mg/dL (ref 0.0–1.0)
pH: 5 (ref 5.0–8.0)

## 2015-11-04 MED ORDER — RHO D IMMUNE GLOBULIN 1500 UNIT/2ML IJ SOSY
300.0000 ug | PREFILLED_SYRINGE | Freq: Once | INTRAMUSCULAR | Status: AC
  Administered 2015-11-04: 300 ug via INTRAMUSCULAR
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
## 2015-11-04 NOTE — Progress Notes (Signed)
U/S scheduled for 11/06/2015 :00PM
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
## 2015-11-04 NOTE — Progress Notes (Signed)
New ob packet/28 wk packet given   

## 2015-11-06 ENCOUNTER — Encounter: Payer: Self-pay | Admitting: *Deleted

## 2015-11-06 ENCOUNTER — Other Ambulatory Visit: Payer: Self-pay | Admitting: Obstetrics and Gynecology

## 2015-11-06 ENCOUNTER — Ambulatory Visit (HOSPITAL_COMMUNITY)
Admission: RE | Admit: 2015-11-06 | Discharge: 2015-11-06 | Disposition: A | Discharge status: Home / Self Care | Payer: Medicaid Other | Source: Ambulatory Visit | Attending: Obstetrics and Gynecology | Admitting: Obstetrics and Gynecology

## 2015-11-06 DIAGNOSIS — Z3A35 35 weeks gestation of pregnancy: Secondary | ICD-10-CM

## 2015-11-06 DIAGNOSIS — O0933 Supervision of pregnancy with insufficient antenatal care, third trimester: Secondary | ICD-10-CM | POA: Diagnosis not present

## 2015-11-06 DIAGNOSIS — R102 Pelvic and perineal pain: Secondary | ICD-10-CM

## 2015-11-06 DIAGNOSIS — O26899 Other specified pregnancy related conditions, unspecified trimester: Secondary | ICD-10-CM

## 2015-11-06 DIAGNOSIS — O09893 Supervision of other high risk pregnancies, third trimester: Secondary | ICD-10-CM

## 2015-11-06 DIAGNOSIS — R109 Unspecified abdominal pain: Secondary | ICD-10-CM

## 2015-11-06 DIAGNOSIS — Z0489 Encounter for examination and observation for other specified reasons: Secondary | ICD-10-CM

## 2015-11-06 DIAGNOSIS — Z36 Encounter for antenatal screening of mother: Secondary | ICD-10-CM | POA: Insufficient documentation

## 2015-11-06 DIAGNOSIS — O09523 Supervision of elderly multigravida, third trimester: Secondary | ICD-10-CM | POA: Insufficient documentation

## 2015-11-06 DIAGNOSIS — O09213 Supervision of pregnancy with history of pre-term labor, third trimester: Secondary | ICD-10-CM

## 2015-11-06 DIAGNOSIS — O0990 Supervision of high risk pregnancy, unspecified, unspecified trimester: Secondary | ICD-10-CM

## 2015-11-06 DIAGNOSIS — IMO0002 Reserved for concepts with insufficient information to code with codable children: Secondary | ICD-10-CM

## 2015-11-10 ENCOUNTER — Inpatient Hospital Stay (HOSPITAL_COMMUNITY)
Admission: AD | Admit: 2015-11-10 | Discharge: 2015-11-10 | Discharge status: Against Medical Advice | Payer: Medicaid Other | Source: Ambulatory Visit | Attending: Obstetrics & Gynecology | Admitting: Obstetrics & Gynecology

## 2015-11-10 ENCOUNTER — Inpatient Hospital Stay (HOSPITAL_COMMUNITY): Payer: Medicaid Other

## 2015-11-10 ENCOUNTER — Encounter (HOSPITAL_COMMUNITY): Payer: Self-pay

## 2015-11-10 DIAGNOSIS — IMO0002 Reserved for concepts with insufficient information to code with codable children: Secondary | ICD-10-CM

## 2015-11-10 DIAGNOSIS — K219 Gastro-esophageal reflux disease without esophagitis: Secondary | ICD-10-CM | POA: Diagnosis not present

## 2015-11-10 DIAGNOSIS — Z3A35 35 weeks gestation of pregnancy: Secondary | ICD-10-CM | POA: Diagnosis not present

## 2015-11-10 DIAGNOSIS — Z87891 Personal history of nicotine dependence: Secondary | ICD-10-CM | POA: Diagnosis not present

## 2015-11-10 DIAGNOSIS — R109 Unspecified abdominal pain: Secondary | ICD-10-CM | POA: Diagnosis present

## 2015-11-10 DIAGNOSIS — O9989 Other specified diseases and conditions complicating pregnancy, childbirth and the puerperium: Secondary | ICD-10-CM

## 2015-11-10 DIAGNOSIS — O99613 Diseases of the digestive system complicating pregnancy, third trimester: Secondary | ICD-10-CM | POA: Diagnosis not present

## 2015-11-10 LAB — RAPID URINE DRUG SCREEN, HOSP PERFORMED
Amphetamines: NOT DETECTED
Barbiturates: NOT DETECTED
Benzodiazepines: NOT DETECTED
Cocaine: NOT DETECTED
Opiates: NOT DETECTED
Tetrahydrocannabinol: NOT DETECTED

## 2015-11-10 LAB — URINALYSIS, ROUTINE W REFLEX MICROSCOPIC
Bilirubin Urine: NEGATIVE
Glucose, UA: NEGATIVE mg/dL
Hgb urine dipstick: NEGATIVE
Ketones, ur: 15 mg/dL — AB
Leukocytes, UA: NEGATIVE
Nitrite: NEGATIVE
Protein, ur: NEGATIVE mg/dL
Specific Gravity, Urine: 1.025 (ref 1.005–1.030)
pH: 6 (ref 5.0–8.0)
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
## 2015-11-10 NOTE — MAU Note (Signed)
Notified provider that patient is here.   

## 2015-11-10 NOTE — Discharge Instructions (Signed)
Fetal Movement Counts  Patient Name: __________________________________________________ Patient Due Date: ____________________  Performing a fetal movement count is highly recommended in high-risk pregnancies, but it is good for every pregnant woman to do. Your health care provider may ask you to start counting fetal movements at 28 weeks of the pregnancy. Fetal movements often increase:  · After eating a full meal.  · After physical activity.  · After eating or drinking something sweet or cold.  · At rest.  Pay attention to when you feel the baby is most active. This will help you notice a pattern of your baby's sleep and wake cycles and what factors contribute to an increase in fetal movement. It is important to perform a fetal movement count at the same time each day when your baby is normally most active.   HOW TO COUNT FETAL MOVEMENTS  1. Find a quiet and comfortable area to sit or lie down on your left side. Lying on your left side provides the best blood and oxygen circulation to your baby.  2. Write down the day and time on a sheet of paper or in a journal.  3. Start counting kicks, flutters, swishes, rolls, or jabs in a 2-hour period. You should feel at least 10 movements within 2 hours.  4. If you do not feel 10 movements in 2 hours, wait 2-3 hours and count again. Look for a change in the pattern or not enough counts in 2 hours.  SEEK MEDICAL CARE IF:  · You feel less than 10 counts in 2 hours, tried twice.  · There is no movement in over an hour.  · The pattern is changing or taking longer each day to reach 10 counts in 2 hours.  · You feel the baby is not moving as he or she usually does.  Date: ____________ Movements: ____________ Start time: ____________ Finish time: ____________   Date: ____________ Movements: ____________ Start time: ____________ Finish time: ____________  Date: ____________ Movements: ____________ Start time: ____________ Finish time: ____________  Date: ____________ Movements:  ____________ Start time: ____________ Finish time: ____________  Date: ____________ Movements: ____________ Start time: ____________ Finish time: ____________  Date: ____________ Movements: ____________ Start time: ____________ Finish time: ____________  Date: ____________ Movements: ____________ Start time: ____________ Finish time: ____________  Date: ____________ Movements: ____________ Start time: ____________ Finish time: ____________   Date: ____________ Movements: ____________ Start time: ____________ Finish time: ____________  Date: ____________ Movements: ____________ Start time: ____________ Finish time: ____________  Date: ____________ Movements: ____________ Start time: ____________ Finish time: ____________  Date: ____________ Movements: ____________ Start time: ____________ Finish time: ____________  Date: ____________ Movements: ____________ Start time: ____________ Finish time: ____________  Date: ____________ Movements: ____________ Start time: ____________ Finish time: ____________  Date: ____________ Movements: ____________ Start time: ____________ Finish time: ____________   Date: ____________ Movements: ____________ Start time: ____________ Finish time: ____________  Date: ____________ Movements: ____________ Start time: ____________ Finish time: ____________  Date: ____________ Movements: ____________ Start time: ____________ Finish time: ____________  Date: ____________ Movements: ____________ Start time: ____________ Finish time: ____________  Date: ____________ Movements: ____________ Start time: ____________ Finish time: ____________  Date: ____________ Movements: ____________ Start time: ____________ Finish time: ____________  Date: ____________ Movements: ____________ Start time: ____________ Finish time: ____________   Date: ____________ Movements: ____________ Start time: ____________ Finish time: ____________  Date: ____________ Movements: ____________ Start time: ____________ Finish  time: ____________  Date: ____________ Movements: ____________ Start time: ____________ Finish time: ____________  Date: ____________ Movements: ____________ Start time:   ____________ Finish time: ____________  Date: ____________ Movements: ____________ Start time: ____________ Finish time: ____________  Date: ____________ Movements: ____________ Start time: ____________ Finish time: ____________  Date: ____________ Movements: ____________ Start time: ____________ Finish time: ____________   Date: ____________ Movements: ____________ Start time: ____________ Finish time: ____________  Date: ____________ Movements: ____________ Start time: ____________ Finish time: ____________  Date: ____________ Movements: ____________ Start time: ____________ Finish time: ____________  Date: ____________ Movements: ____________ Start time: ____________ Finish time: ____________  Date: ____________ Movements: ____________ Start time: ____________ Finish time: ____________  Date: ____________ Movements: ____________ Start time: ____________ Finish time: ____________  Date: ____________ Movements: ____________ Start time: ____________ Finish time: ____________   Date: ____________ Movements: ____________ Start time: ____________ Finish time: ____________  Date: ____________ Movements: ____________ Start time: ____________ Finish time: ____________  Date: ____________ Movements: ____________ Start time: ____________ Finish time: ____________  Date: ____________ Movements: ____________ Start time: ____________ Finish time: ____________  Date: ____________ Movements: ____________ Start time: ____________ Finish time: ____________  Date: ____________ Movements: ____________ Start time: ____________ Finish time: ____________  Date: ____________ Movements: ____________ Start time: ____________ Finish time: ____________   Date: ____________ Movements: ____________ Start time: ____________ Finish time: ____________  Date: ____________  Movements: ____________ Start time: ____________ Finish time: ____________  Date: ____________ Movements: ____________ Start time: ____________ Finish time: ____________  Date: ____________ Movements: ____________ Start time: ____________ Finish time: ____________  Date: ____________ Movements: ____________ Start time: ____________ Finish time: ____________  Date: ____________ Movements: ____________ Start time: ____________ Finish time: ____________  Date: ____________ Movements: ____________ Start time: ____________ Finish time: ____________   Date: ____________ Movements: ____________ Start time: ____________ Finish time: ____________  Date: ____________ Movements: ____________ Start time: ____________ Finish time: ____________  Date: ____________ Movements: ____________ Start time: ____________ Finish time: ____________  Date: ____________ Movements: ____________ Start time: ____________ Finish time: ____________  Date: ____________ Movements: ____________ Start time: ____________ Finish time: ____________  Date: ____________ Movements: ____________ Start time: ____________ Finish time: ____________     This information is not intended to replace advice given to you by your health care provider. Make sure you discuss any questions you have with your health care provider.     Document Released: 09/23/2006 Document Revised: 09/14/2014 Document Reviewed: 06/20/2012  Elsevier Interactive Patient Education ©2016 [employer]

## 2015-11-10 NOTE — MAU Note (Signed)
Patient came back form ultrasound got dressed. Took her IV out herself that was [facility] and left AMA out the back door before the provider could go over her ultrasound report with her.

## 2015-11-10 NOTE — MAU Note (Signed)
Patient presents with c/o abdominal pain. Patient was seen at [facility] and baby had decel but left AMA because she did not like the way she was treated. Patient [facility].

## 2015-11-11 ENCOUNTER — Ambulatory Visit (INDEPENDENT_AMBULATORY_CARE_PROVIDER_SITE_OTHER): Payer: Medicaid Other | Admitting: Obstetrics and Gynecology

## 2015-11-11 ENCOUNTER — Other Ambulatory Visit (HOSPITAL_COMMUNITY)
Admission: RE | Admit: 2015-11-11 | Discharge: 2015-11-11 | Disposition: A | Discharge status: Home / Self Care | Payer: Medicaid Other | Source: Ambulatory Visit | Attending: Obstetrics and Gynecology | Admitting: Obstetrics and Gynecology

## 2015-11-11 VITALS — BP 120/69 | HR 98 | Wt 206.0 lb

## 2015-11-11 DIAGNOSIS — O99013 Anemia complicating pregnancy, third trimester: Secondary | ICD-10-CM

## 2015-11-11 DIAGNOSIS — Z113 Encounter for screening for infections with a predominantly sexual mode of transmission: Secondary | ICD-10-CM | POA: Insufficient documentation

## 2015-11-11 DIAGNOSIS — D573 Sickle-cell trait: Secondary | ICD-10-CM

## 2015-11-11 DIAGNOSIS — Z8751 Personal history of pre-term labor: Secondary | ICD-10-CM

## 2015-11-11 DIAGNOSIS — O09213 Supervision of pregnancy with history of pre-term labor, third trimester: Secondary | ICD-10-CM

## 2015-11-11 DIAGNOSIS — G4700 Insomnia, unspecified: Secondary | ICD-10-CM

## 2015-11-11 LAB — OB RESULTS CONSOLE GBS: GBS: NEGATIVE

## 2015-11-11 LAB — OB RESULTS CONSOLE GC/CHLAMYDIA: Gonorrhea: NEGATIVE

## 2015-11-11 MED ORDER — ZOLPIDEM TARTRATE 10 MG PO TABS
10.0000 mg | ORAL_TABLET | Freq: Every evening | ORAL | Status: DC | PRN
Start: 2015-11-11 — End: 2015-12-13

## 2015-11-11 NOTE — Patient Instructions (Addendum)
Third Trimester of Pregnancy The third trimester is from week 29 through week 42, months 7 through 9. The third trimester is a time when the fetus is growing rapidly. At the end of the ninth month, the fetus is about 20 inches in length and weighs 6-10 pounds.  BODY CHANGES Your body goes through many changes during pregnancy. The changes vary from woman to woman.   Your weight will continue to increase. You can expect to gain 25-35 pounds (11-16 kg) by the end of the pregnancy.  You may begin to get stretch marks on your hips, abdomen, and breasts.  You may urinate more often because the fetus is moving lower into your pelvis and pressing on your bladder.  You may develop or continue to have heartburn as a result of your pregnancy.  You may develop constipation because certain hormones are causing the muscles that push waste through your intestines to slow down.  You may develop hemorrhoids or swollen, bulging veins (varicose veins).  You may have pelvic pain because of the weight gain and pregnancy hormones relaxing your joints between the bones in your pelvis. Backaches may result from overexertion of the muscles supporting your posture.  You may have changes in your hair. These can include thickening of your hair, rapid growth, and changes in texture. Some women also have hair loss during or after pregnancy, or hair that feels dry or thin. Your hair will most likely return to normal after your baby is born.  Your breasts will continue to grow and be tender. A yellow discharge may leak from your breasts called colostrum.  Your belly button may stick out.  You may feel short of breath because of your expanding uterus.  You may notice the fetus "dropping," or moving lower in your abdomen.  You may have a bloody mucus discharge. This usually occurs a few days to a week before labor begins.  Your cervix becomes thin and soft (effaced) near your due date. WHAT TO EXPECT AT YOUR PRENATAL  EXAMS  You will have prenatal exams every 2 weeks until week 36. Then, you will have weekly prenatal exams. During a routine prenatal visit:  You will be weighed to make sure you and the fetus are growing normally.  Your blood pressure is taken.  Your abdomen will be measured to track your baby's growth.  The fetal heartbeat will be listened to.  Any test results from the previous visit will be discussed.  You may have a cervical check near your due date to see if you have effaced. At around 36 weeks, your caregiver will check your cervix. At the same time, your caregiver will also perform a test on the secretions of the vaginal tissue. This test is to determine if a type of bacteria, Group B streptococcus, is present. Your caregiver will explain this further. Your caregiver may ask you:  What your birth plan is.  How you are feeling.  If you are feeling the baby move.  If you have had any abnormal symptoms, such as leaking fluid, bleeding, severe headaches, or abdominal cramping.  If you are using any tobacco products, including cigarettes, chewing tobacco, and electronic cigarettes.  If you have any questions. Other tests or screenings that may be performed during your third trimester include:  Blood tests that check for low iron levels (anemia).  Fetal testing to check the health, activity level, and growth of the fetus. Testing is done if you have certain medical conditions or if   there are problems during the pregnancy.  HIV (human immunodeficiency virus) testing. If you are at high risk, you may be screened for HIV during your third trimester of pregnancy. FALSE LABOR You may feel small, irregular contractions that eventually go away. These are called Braxton Hicks contractions, or false labor. Contractions may last for hours, days, or even weeks before true labor sets in. If contractions come at regular intervals, intensify, or become painful, it is best to be seen by your  caregiver.  SIGNS OF LABOR   Menstrual-like cramps.  Contractions that are 5 minutes apart or less.  Contractions that start on the top of the uterus and spread down to the lower abdomen and back.  A sense of increased pelvic pressure or back pain.  A watery or bloody mucus discharge that comes from the vagina. If you have any of these signs before the 37th week of pregnancy, call your caregiver right away. You need to go to the hospital to get checked immediately. HOME CARE INSTRUCTIONS   Avoid all smoking, herbs, alcohol, and unprescribed drugs. These chemicals affect the formation and growth of the baby.  Do not use any tobacco products, including cigarettes, chewing tobacco, and electronic cigarettes. If you need help quitting, ask your health care provider. You may receive counseling support and other resources to help you quit.  Follow your caregiver's instructions regarding medicine use. There are medicines that are either safe or unsafe to take during pregnancy.  Exercise only as directed by your caregiver. Experiencing uterine cramps is a good sign to stop exercising.  Continue to eat regular, healthy meals.  Wear a good support bra for breast tenderness.  Do not use hot tubs, steam rooms, or saunas.  Wear your seat belt at all times when driving.  Avoid raw meat, uncooked cheese, cat litter boxes, and soil used by cats. These carry germs that can cause birth defects in the baby.  Take your prenatal vitamins.  Take 1500-2000 mg of calcium daily starting at the 20th week of pregnancy until you deliver your baby.  Try taking a stool softener (if your caregiver approves) if you develop constipation. Eat more high-fiber foods, such as fresh vegetables or fruit and whole grains. Drink plenty of fluids to keep your urine clear or pale yellow.  Take warm sitz baths to soothe any pain or discomfort caused by hemorrhoids. Use hemorrhoid cream if your caregiver approves.  If  you develop varicose veins, wear support hose. Elevate your feet for 15 minutes, 3-4 times a day. Limit salt in your diet.  Avoid heavy lifting, wear low heal shoes, and practice good posture.  Rest a lot with your legs elevated if you have leg cramps or low back pain.  Visit your dentist if you have not gone during your pregnancy. Use a soft toothbrush to brush your teeth and be gentle when you floss.  A sexual relationship may be continued unless your caregiver directs you otherwise.  Do not travel far distances unless it is absolutely necessary and only with the approval of your caregiver.  Take prenatal classes to understand, practice, and ask questions about the labor and delivery.  Make a trial run to the hospital.  Pack your hospital bag.  Prepare the baby's nursery.  Continue to go to all your prenatal visits as directed by your caregiver. SEEK MEDICAL CARE IF:  You are unsure if you are in labor or if your water has broken.  You have dizziness.  You have  mild pelvic cramps, pelvic pressure, or nagging pain in your abdominal area.  You have persistent nausea, vomiting, or diarrhea.  You have a bad smelling vaginal discharge.  You have pain with urination. SEEK IMMEDIATE MEDICAL CARE IF:   You have a fever.  You are leaking fluid from your vagina.  You have spotting or bleeding from your vagina.  You have severe abdominal cramping or pain.  You have rapid weight loss or gain.  You have shortness of breath with chest pain.  You notice sudden or extreme swelling of your face, hands, ankles, feet, or legs.  You have not felt your baby move in over an hour.  You have severe headaches that do not go away with medicine.  You have vision changes.   This information is not intended to replace advice given to you by your health care provider. Make sure you discuss any questions you have with your health care provider.   Document Released: 08/18/2001 Document  Revised: 09/14/2014 Document Reviewed: 10/25/2012 Elsevier Interactive Patient Education 2016 [employer] Inc[employer] Round Ligament Pain During Pregnancy   Round ligament pain is a sharp pain or jabbing feeling often felt in the lower belly or groin area on one or both sides. It is one of the most common complaints during pregnancy and is considered a normal part of pregnancy. It is most often felt during the second trimester.   Here is what you need to know about round ligament pain, including some tips to help you feel better.   Causes of Round Ligament Pain   Several thick ligaments surround and support your womb (uterus) as it grows during pregnancy. One of them is called the round ligament.   The round ligament connects the front part of the womb to your groin, the area where your legs attach to your pelvis. The round ligament normally tightens and relaxes slowly.   As your baby and womb grow, the round ligament stretches. That makes it more likely to become strained.   Sudden movements can cause the ligament to tighten quickly, like a rubber band snapping. This causes a sudden and quick jabbing feeling.   Symptoms of Round Ligament Pain   Round ligament pain can be concerning and uncomfortable. But it is considered normal as your body changes during pregnancy.   The symptoms of round ligament pain include a sharp, sudden spasm in the belly. It usually affects the right side, but it may happen on both sides. The pain only lasts a few seconds.   Exercise may cause the pain, as will rapid movements such as:  sneezing  coughing  laughing  rolling over in bed  standing up too quickly   Treatment of Round Ligament Pain   Here are some tips that may help reduce your discomfort:   Pain relief. Take over-the-counter acetaminophen for pain, if necessary. Ask your doctor if this is OK.   Exercise. Get plenty of exercise to keep your stomach (core) muscles strong. Doing stretching exercises or  prenatal yoga can be helpful. Ask your doctor which exercises are safe for you and your baby.   A helpful exercise involves putting your hands and knees on the floor, lowering your head, and pushing your backside into the air.   Avoid sudden movements. Change positions slowly (such as standing up or sitting down) to avoid sudden movements that may cause stretching and pain.   Flex your hips. Bend and flex your hips before you cough, sneeze, or laugh to avoid  pulling on the ligaments.   Apply warmth. A heating pad or warm bath may be helpful. Ask your doctor if this is OK. Extreme heat can be dangerous to the baby.   You should try to modify your daily activity level and avoid positions that may worsen the condition.   When to Call the Doctor/Midwife   Always tell your doctor or midwife about any type of pain you have during pregnancy. Round ligament pain is quick and doesn't last long.   Call your health care provider immediately if you have:  severe pain  fever  chills  pain on urination  difficulty walking   Belly pain during pregnancy can be due to many different causes. It is important for your doctor to rule out more serious conditions, including pregnancy complications such as placenta abruption or non-pregnancy illnesses such as:  inguinal hernia  appendicitis  stomach, liver, and kidney problems  Preterm labor pains may sometimes be mistaken for round ligament pain.    lig

## 2015-11-11 NOTE — Progress Notes (Signed)
Patient in [facility] last night- left all AMA.  Patient states she is contracting more than last night. Was 2cm.  Did not receive monitoring, was told baby's heart rate dropped three different times

## 2015-11-11 NOTE — Progress Notes (Signed)
Subjective:  Jenna Harding is a 35 y.o. [id number] at 6326w6d being seen today for ongoing prenatal care.  She is currently monitored for the following issues for this high-risk pregnancy and has Obstetrical trauma, antepartum; Supervision of high-risk pregnancy; RhD negative; Sickle cell trait (HCC); and History of preterm delivery on her problem list.  Patient reports painful presistent UCs and no sleep x 3 days and nights due to discomfort. Pain in abd, groin, low back with moving and walking. Insistent that she is at least 37 wks now.   Contractions: Irregular. Vag. Bleeding: None.  Movement: Present. Denies leaking of fluid.  [facility]. Had reactive NST and no regular UCs, Had BPP 8/8 with AFI 20  The following portions of the patient's history were reviewed and updated as appropriate: allergies, current medications, past family history, past medical history, past social history, past surgical history and problem list. Problem list updated.  Objective:   Filed Vitals:   11/11/15 1123  BP: 120/69  Pulse: 98  Weight: 206 lb (93.441 kg)    Fetal Status: Fetal Heart Rate (bpm): 131   Movement: Present     General:  Alert, oriented and cooperative. Patient is in no acute distress.  Skin: Skin is warm and dry. No rash noted.   Cardiovascular: Normal heart rate noted  Respiratory: Normal respiratory effort, no problems with respiration noted  Abdomen: Soft, gravid, appropriate for gestational age. Pain/Pressure: Present     Pelvic: Vag. Bleeding: None     Cervical exam performed      posterior/2/long/-3  Extremities: Normal range of motion.  Edema: None  Mental Status: Normal mood and affect. Normal behavior. Normal judgment and thought content.  No CVAT Urinalysis:    WNL last night in [facility]  Assessment and Plan:  Pregnancy: [id number] at 1826w6d  History of preterm delivery - Plan: GC[facility]Chlamydia probe amp ([place]  Insomnia - Plan: Culture, beta strep  (group b only), GC[facility]Chlamydia probe amp ([place] Round ligament/pelvic girdle pain   Preterm labor symptoms and general obstetric precautions including but not limited to vaginal bleeding, contractions, leaking of fluid and fetal movement were reviewed in detail with the patient. Please refer to After Visit Summary for other counseling recommendations. Reviewed dating and POC  Ambien 4 tabs NR given to take if Tylenol PM not working  Return in about 1 week (around 11/18/2015).   Danae Orleanseirdre C Malley Hauter, CNM

## 2015-11-12 LAB — GC/CHLAMYDIA PROBE AMP (~~LOC~~) NOT AT ARMC
Chlamydia: NEGATIVE
Neisseria Gonorrhea: NEGATIVE

## 2015-11-13 LAB — CULTURE, BETA STREP (GROUP B ONLY)

## 2015-11-17 ENCOUNTER — Encounter (HOSPITAL_COMMUNITY): Payer: Self-pay | Admitting: *Deleted

## 2015-11-17 ENCOUNTER — Inpatient Hospital Stay (HOSPITAL_COMMUNITY)
Admission: AD | Admit: 2015-11-17 | Discharge: 2015-11-17 | Disposition: A | Discharge status: Home / Self Care | Payer: Medicaid Other | Source: Ambulatory Visit | Attending: Obstetrics & Gynecology | Admitting: Obstetrics & Gynecology

## 2015-11-17 DIAGNOSIS — Z3493 Encounter for supervision of normal pregnancy, unspecified, third trimester: Secondary | ICD-10-CM | POA: Insufficient documentation

## 2015-11-17 NOTE — MAU Note (Signed)
Having some contractions for couple day. Having burning in my cervix area and back pain.  Pain woke me up out of my sleep.

## 2015-11-17 NOTE — Discharge Instructions (Signed)
Third Trimester of Pregnancy °The third trimester is from week 29 through week 42, months 7 through 9. The third trimester is a time when the fetus is growing rapidly. At the end of the ninth month, the fetus is about 20 inches in length and weighs 6-10 pounds.  °BODY CHANGES °Your body goes through many changes during pregnancy. The changes vary from woman to woman.  °· Your weight will continue to increase. You can expect to gain 25-35 pounds (11-16 kg) by the end of the pregnancy. °· You may begin to get stretch marks on your hips, abdomen, and breasts. °· You may urinate more often because the fetus is moving lower into your pelvis and pressing on your bladder. °· You may develop or continue to have heartburn as a result of your pregnancy. °· You may develop constipation because certain hormones are causing the muscles that push waste through your intestines to slow down. °· You may develop hemorrhoids or swollen, bulging veins (varicose veins). °· You may have pelvic pain because of the weight gain and pregnancy hormones relaxing your joints between the bones in your pelvis. Backaches may result from overexertion of the muscles supporting your posture. °· You may have changes in your hair. These can include thickening of your hair, rapid growth, and changes in texture. Some women also have hair loss during or after pregnancy, or hair that feels dry or thin. Your hair will most likely return to normal after your baby is born. °· Your breasts will continue to grow and be tender. A yellow discharge may leak from your breasts called colostrum. °· Your belly button may stick out. °· You may feel short of breath because of your expanding uterus. °· You may notice the fetus "dropping," or moving lower in your abdomen. °· You may have a bloody mucus discharge. This usually occurs a few days to a week before labor begins. °· Your cervix becomes thin and soft (effaced) near your due date. °WHAT TO EXPECT AT YOUR PRENATAL  EXAMS  °You will have prenatal exams every 2 weeks until week 36. Then, you will have weekly prenatal exams. During a routine prenatal visit: °· You will be weighed to make sure you and the fetus are growing normally. °· Your blood pressure is taken. °· Your abdomen will be measured to track your baby's growth. °· The fetal heartbeat will be listened to. °· Any test results from the previous visit will be discussed. °· You may have a cervical check near your due date to see if you have effaced. °At around 36 weeks, your caregiver will check your cervix. At the same time, your caregiver will also perform a test on the secretions of the vaginal tissue. This test is to determine if a type of bacteria, Group B streptococcus, is present. Your caregiver will explain this further. °Your caregiver may ask you: °· What your birth plan is. °· How you are feeling. °· If you are feeling the baby move. °· If you have had any abnormal symptoms, such as leaking fluid, bleeding, severe headaches, or abdominal cramping. °· If you are using any tobacco products, including cigarettes, chewing tobacco, and electronic cigarettes. °· If you have any questions. °Other tests or screenings that may be performed during your third trimester include: °· Blood tests that check for low iron levels (anemia). °· Fetal testing to check the health, activity level, and growth of the fetus. Testing is done if you have certain medical conditions or if   there are problems during the pregnancy. °· HIV (human immunodeficiency virus) testing. If you are at high risk, you may be screened for HIV during your third trimester of pregnancy. °FALSE LABOR °You may feel small, irregular contractions that eventually go away. These are called Braxton Hicks contractions, or false labor. Contractions may last for hours, days, or even weeks before true labor sets in. If contractions come at regular intervals, intensify, or become painful, it is best to be seen by your  caregiver.  °SIGNS OF LABOR  °· Menstrual-like cramps. °· Contractions that are 5 minutes apart or less. °· Contractions that start on the top of the uterus and spread down to the lower abdomen and back. °· A sense of increased pelvic pressure or back pain. °· A watery or bloody mucus discharge that comes from the vagina. °If you have any of these signs before the 37th week of pregnancy, call your caregiver right away. You need to go to the hospital to get checked immediately. °HOME CARE INSTRUCTIONS  °· Avoid all smoking, herbs, alcohol, and unprescribed drugs. These chemicals affect the formation and growth of the baby. °· Do not use any tobacco products, including cigarettes, chewing tobacco, and electronic cigarettes. If you need help quitting, ask your health care provider. You may receive counseling support and other resources to help you quit. °· Follow your caregiver's instructions regarding medicine use. There are medicines that are either safe or unsafe to take during pregnancy. °· Exercise only as directed by your caregiver. Experiencing uterine cramps is a good sign to stop exercising. °· Continue to eat regular, healthy meals. °· Wear a good support bra for breast tenderness. °· Do not use hot tubs, steam rooms, or saunas. °· Wear your seat belt at all times when driving. °· Avoid raw meat, uncooked cheese, cat litter boxes, and soil used by cats. These carry germs that can cause birth defects in the baby. °· Take your prenatal vitamins. °· Take 1500-2000 mg of calcium daily starting at the 20th week of pregnancy until you deliver your baby. °· Try taking a stool softener (if your caregiver approves) if you develop constipation. Eat more high-fiber foods, such as fresh vegetables or fruit and whole grains. Drink plenty of fluids to keep your urine clear or pale yellow. °· Take warm sitz baths to soothe any pain or discomfort caused by hemorrhoids. Use hemorrhoid cream if your caregiver approves. °· If  you develop varicose veins, wear support hose. Elevate your feet for 15 minutes, 3-4 times a day. Limit salt in your diet. °· Avoid heavy lifting, wear low heal shoes, and practice good posture. °· Rest a lot with your legs elevated if you have leg cramps or low back pain. °· Visit your dentist if you have not gone during your pregnancy. Use a soft toothbrush to brush your teeth and be gentle when you floss. °· A sexual relationship may be continued unless your caregiver directs you otherwise. °· Do not travel far distances unless it is absolutely necessary and only with the approval of your caregiver. °· Take prenatal classes to understand, practice, and ask questions about the labor and delivery. °· Make a trial run to the hospital. °· Pack your hospital bag. °· Prepare the baby's nursery. °· Continue to go to all your prenatal visits as directed by your caregiver. °SEEK MEDICAL CARE IF: °· You are unsure if you are in labor or if your water has broken. °· You have dizziness. °· You have   mild pelvic cramps, pelvic pressure, or nagging pain in your abdominal area. °· You have persistent nausea, vomiting, or diarrhea. °· You have a bad smelling vaginal discharge. °· You have pain with urination. °SEEK IMMEDIATE MEDICAL CARE IF:  °· You have a fever. °· You are leaking fluid from your vagina. °· You have spotting or bleeding from your vagina. °· You have severe abdominal cramping or pain. °· You have rapid weight loss or gain. °· You have shortness of breath with chest pain. °· You notice sudden or extreme swelling of your face, hands, ankles, feet, or legs. °· You have not felt your baby move in over an hour. °· You have severe headaches that do not go away with medicine. °· You have vision changes. °  °This information is not intended to replace advice given to you by your health care provider. Make sure you discuss any questions you have with your health care provider. °  °Document Released: 08/18/2001 Document  Revised: 09/14/2014 Document Reviewed: 10/25/2012 °Elsevier Interactive Patient Education ©2016 [employer] °Fetal Movement Counts °Patient Name: __________________________________________________ Patient Due Date: ____________________ °Performing a fetal movement count is highly recommended in high-risk pregnancies, but it is good for every pregnant woman to do. Your health care provider may ask you to start counting fetal movements at 28 weeks of the pregnancy. Fetal movements often increase: °· After eating a full meal. °· After physical activity. °· After eating or drinking something sweet or cold. °· At rest. °Pay attention to when you feel the baby is most active. This will help you notice a pattern of your baby's sleep and wake cycles and what factors contribute to an increase in fetal movement. It is important to perform a fetal movement count at the same time each day when your baby is normally most active.  °HOW TO COUNT FETAL MOVEMENTS °· Find a quiet and comfortable area to sit or lie down on your left side. Lying on your left side provides the best blood and oxygen circulation to your baby. °· Write down the day and time on a sheet of paper or in a journal. °· Start counting kicks, flutters, swishes, rolls, or jabs in a 2-hour period. You should feel at least 10 movements within 2 hours. °· If you do not feel 10 movements in 2 hours, wait 2-3 hours and count again. Look for a change in the pattern or not enough counts in 2 hours. °SEEK MEDICAL CARE IF: °· You feel less than 10 counts in 2 hours, tried twice. °· There is no movement in over an hour. °· The pattern is changing or taking longer each day to reach 10 counts in 2 hours. °· You feel the baby is not moving as he or she usually does. °Date: ____________ Movements: ____________ Start time: ____________ Finish time: ____________  °Date: ____________ Movements: ____________ Start time: ____________ Finish time: ____________ °Date: ____________  Movements: ____________ Start time: ____________ Finish time: ____________ °Date: ____________ Movements: ____________ Start time: ____________ Finish time: ____________ °Date: ____________ Movements: ____________ Start time: ____________ Finish time: ____________ °Date: ____________ Movements: ____________ Start time: ____________ Finish time: ____________ °Date: ____________ Movements: ____________ Start time: ____________ Finish time: ____________ °Date: ____________ Movements: ____________ Start time: ____________ Finish time: ____________  °Date: ____________ Movements: ____________ Start time: ____________ Finish time: ____________ °Date: ____________ Movements: ____________ Start time: ____________ Finish time: ____________ °Date: ____________ Movements: ____________ Start time: ____________ Finish time: ____________ °Date: ____________ Movements: ____________ Start time: ____________ Finish time: ____________ °Date:   ____________ Movements: ____________ Start time: ____________ Finish time: ____________ °Date: ____________ Movements: ____________ Start time: ____________ Finish time: ____________ °Date: ____________ Movements: ____________ Start time: ____________ Finish time: ____________  °Date: ____________ Movements: ____________ Start time: ____________ Finish time: ____________ °Date: ____________ Movements: ____________ Start time: ____________ Finish time: ____________ °Date: ____________ Movements: ____________ Start time: ____________ Finish time: ____________ °Date: ____________ Movements: ____________ Start time: ____________ Finish time: ____________ °Date: ____________ Movements: ____________ Start time: ____________ Finish time: ____________ °Date: ____________ Movements: ____________ Start time: ____________ Finish time: ____________ °Date: ____________ Movements: ____________ Start time: ____________ Finish time: ____________  °Date: ____________ Movements: ____________ Start time:  ____________ Finish time: ____________ °Date: ____________ Movements: ____________ Start time: ____________ Finish time: ____________ °Date: ____________ Movements: ____________ Start time: ____________ Finish time: ____________ °Date: ____________ Movements: ____________ Start time: ____________ Finish time: ____________ °Date: ____________ Movements: ____________ Start time: ____________ Finish time: ____________ °Date: ____________ Movements: ____________ Start time: ____________ Finish time: ____________ °Date: ____________ Movements: ____________ Start time: ____________ Finish time: ____________  °Date: ____________ Movements: ____________ Start time: ____________ Finish time: ____________ °Date: ____________ Movements: ____________ Start time: ____________ Finish time: ____________ °Date: ____________ Movements: ____________ Start time: ____________ Finish time: ____________ °Date: ____________ Movements: ____________ Start time: ____________ Finish time: ____________ °Date: ____________ Movements: ____________ Start time: ____________ Finish time: ____________ °Date: ____________ Movements: ____________ Start time: ____________ Finish time: ____________ °Date: ____________ Movements: ____________ Start time: ____________ Finish time: ____________  °Date: ____________ Movements: ____________ Start time: ____________ Finish time: ____________ °Date: ____________ Movements: ____________ Start time: ____________ Finish time: ____________ °Date: ____________ Movements: ____________ Start time: ____________ Finish time: ____________ °Date: ____________ Movements: ____________ Start time: ____________ Finish time: ____________ °Date: ____________ Movements: ____________ Start time: ____________ Finish time: ____________ °Date: ____________ Movements: ____________ Start time: ____________ Finish time: ____________ °Date: ____________ Movements: ____________ Start time: ____________ Finish time: ____________  °Date:  ____________ Movements: ____________ Start time: ____________ Finish time: ____________ °Date: ____________ Movements: ____________ Start time: ____________ Finish time: ____________ °Date: ____________ Movements: ____________ Start time: ____________ Finish time: ____________ °Date: ____________ Movements: ____________ Start time: ____________ Finish time: ____________ °Date: ____________ Movements: ____________ Start time: ____________ Finish time: ____________ °Date: ____________ Movements: ____________ Start time: ____________ Finish time: ____________ °Date: ____________ Movements: ____________ Start time: ____________ Finish time: ____________  °Date: ____________ Movements: ____________ Start time: ____________ Finish time: ____________ °Date: ____________ Movements: ____________ Start time: ____________ Finish time: ____________ °Date: ____________ Movements: ____________ Start time: ____________ Finish time: ____________ °Date: ____________ Movements: ____________ Start time: ____________ Finish time: ____________ °Date: ____________ Movements: ____________ Start time: ____________ Finish time: ____________ °Date: ____________ Movements: ____________ Start time: ____________ Finish time: ____________ °  °This information is not intended to replace advice given to you by your health care provider. Make sure you discuss any questions you have with your health care provider. °  °Document Released: 09/23/2006 Document Revised: 09/14/2014 Document Reviewed: 06/20/2012 °Elsevier Interactive Patient Education ©2016 [employer] °Braxton Hicks Contractions °Contractions of the uterus can occur throughout pregnancy. Contractions are not always a sign that you are in labor.  °WHAT ARE BRAXTON HICKS CONTRACTIONS?  °Contractions that occur before labor are called Braxton Hicks contractions, or false labor. Toward the end of pregnancy (32-34 weeks), these contractions can develop more often and may become more  forceful. This is not true labor because these contractions do not result in opening (dilatation) and thinning of the cervix. They are sometimes difficult to tell apart from true labor because these contractions can be forceful and people have different pain tolerances. You should   not feel embarrassed if you go to the hospital with false labor. Sometimes, the only way to tell if you are in true labor is for your health care provider to look for changes in the cervix. °If there are no prenatal problems or other health problems associated with the pregnancy, it is completely safe to be sent home with false labor and await the onset of true labor. °HOW CAN YOU TELL THE DIFFERENCE BETWEEN TRUE AND FALSE LABOR? °False Labor °· The contractions of false labor are usually shorter and not as hard as those of true labor.   °· The contractions are usually irregular.   °· The contractions are often felt in the front of the lower abdomen and in the groin.   °· The contractions may go away when you walk around or change positions while lying down.   °· The contractions get weaker and are shorter lasting as time goes on.   °· The contractions do not usually become progressively stronger, regular, and closer together as with true labor.   °True Labor °· Contractions in true labor last 30-70 seconds, become very regular, usually become more intense, and increase in frequency.   °· The contractions do not go away with walking.   °· The discomfort is usually felt in the top of the uterus and spreads to the lower abdomen and low back.   °· True labor can be determined by your health care provider with an exam. This will show that the cervix is dilating and getting thinner.   °WHAT TO REMEMBER °· Keep up with your usual exercises and follow other instructions given by your health care provider.   °· Take medicines as directed by your health care provider.   °· Keep your regular prenatal appointments.   °· Eat and drink lightly if you  think you are going into labor.   °· If Braxton Hicks contractions are making you uncomfortable:   °· Change your position from lying down or resting to walking, or from walking to resting.   °· Sit and rest in a tub of warm water.   °· Drink 2-3 glasses of water. Dehydration may cause these contractions.   °· Do slow and deep breathing several times an hour.   °WHEN SHOULD I SEEK IMMEDIATE MEDICAL CARE? °Seek immediate medical care if: °· Your contractions become stronger, more regular, and closer together.   °· You have fluid leaking or gushing from your vagina.   °· You have a fever.   °· You pass blood-tinged mucus.   °· You have vaginal bleeding.   °· You have continuous abdominal pain.   °· You have low back pain that you never had before.   °· You feel your baby's head pushing down and causing pelvic pressure.   °· Your baby is not moving as much as it used to.   °  °This information is not intended to replace advice given to you by your health care provider. Make sure you discuss any questions you have with your health care provider. °  °Document Released: 08/24/2005 Document Revised: 08/29/2013 Document Reviewed: 06/05/2013 °Elsevier Interactive Patient Education ©2016 [employer] ° °

## 2015-11-18 ENCOUNTER — Ambulatory Visit (INDEPENDENT_AMBULATORY_CARE_PROVIDER_SITE_OTHER): Payer: Medicaid Other | Admitting: Obstetrics and Gynecology

## 2015-11-18 VITALS — BP 100/70 | HR 84 | Wt 206.0 lb

## 2015-11-18 DIAGNOSIS — Z6791 Unspecified blood type, Rh negative: Secondary | ICD-10-CM

## 2015-11-18 DIAGNOSIS — O99013 Anemia complicating pregnancy, third trimester: Secondary | ICD-10-CM | POA: Diagnosis not present

## 2015-11-18 DIAGNOSIS — O09213 Supervision of pregnancy with history of pre-term labor, third trimester: Secondary | ICD-10-CM

## 2015-11-18 DIAGNOSIS — Z8751 Personal history of pre-term labor: Secondary | ICD-10-CM

## 2015-11-18 DIAGNOSIS — O0993 Supervision of high risk pregnancy, unspecified, third trimester: Secondary | ICD-10-CM

## 2015-11-18 DIAGNOSIS — D573 Sickle-cell trait: Secondary | ICD-10-CM

## 2015-11-18 NOTE — Progress Notes (Signed)
Subjective:  Rosita Kearica Tijerino is a 35 y.o. [id number] at 7264w6d being seen today for ongoing prenatal care.  She is currently monitored for the following issues for this high-risk pregnancy and has Obstetrical trauma, antepartum; Supervision of high-risk pregnancy; RhD negative; Sickle cell trait (HCC); and History of preterm delivery on her problem list.  Patient reports no complaints.  Contractions: Irregular. Vag. Bleeding: None.  Movement: Present. Denies leaking of fluid.   The following portions of the patient's history were reviewed and updated as appropriate: allergies, current medications, past family history, past medical history, past social history, past surgical history and problem list. Problem list updated.  Objective:   Filed Vitals:   11/18/15 1140  BP: 100/70  Pulse: 84  Weight: 206 lb (93.441 kg)    Fetal Status: Fetal Heart Rate (bpm): 145 Fundal Height: 37 cm Movement: Present     General:  Alert, oriented and cooperative. Patient is in no acute distress.  Skin: Skin is warm and dry. No rash noted.   Cardiovascular: Normal heart rate noted  Respiratory: Normal respiratory effort, no problems with respiration noted  Abdomen: Soft, gravid, appropriate for gestational age. Pain/Pressure: Present     Pelvic: Vag. Bleeding: None     Cervical exam deferred        Extremities: Normal range of motion.  Edema: None  Mental Status: Normal mood and affect. Normal behavior. Normal judgment and thought content.   Urinalysis:      Assessment and Plan:  Pregnancy: [id number] at 4564w6d  1. Supervision of high-risk pregnancy, third trimester Patient very upset regarding her dating. This is not the first time I have had this conversation with her. She feels that she is already 39 weeks and desires to be induced Records reviewed. Explained to the patient there are no indication for induction at this time  2. RhD negative S/p rhogam  3. History of preterm delivery Too late for  17-P  Preterm labor symptoms and general obstetric precautions including but not limited to vaginal bleeding, contractions, leaking of fluid and fetal movement were reviewed in detail with the patient. Please refer to After Visit Summary for other counseling recommendations.  No Follow-up on file.   Catalina AntiguaPeggy Mykah Shin, MD

## 2015-11-22 ENCOUNTER — Inpatient Hospital Stay (HOSPITAL_COMMUNITY)
Admission: AD | Admit: 2015-11-22 | Discharge: 2015-11-22 | Disposition: A | Discharge status: Home / Self Care | Payer: Medicaid Other | Source: Ambulatory Visit | Attending: Obstetrics and Gynecology | Admitting: Obstetrics and Gynecology

## 2015-11-22 DIAGNOSIS — Z538 Procedure and treatment not carried out for other reasons: Secondary | ICD-10-CM | POA: Insufficient documentation

## 2015-11-22 NOTE — MAU Note (Signed)
Pt states she is uncomfortable and does not want to be on the monitor anymore

## 2015-11-22 NOTE — Discharge Instructions (Signed)
Keep your scheduled appointment for prenatal care. °

## 2015-11-22 NOTE — MAU Note (Signed)
Patient's cervix is unchanged. No contractions palpated. + FM.  Patient states she would like to have her papers so she can leave. States she has called [facility], [place], [place], [place], [facility]. States she is going to go to [facility].

## 2015-11-22 NOTE — MAU Note (Signed)
Contractions, denies bleeding or ROM 

## 2015-11-22 NOTE — MAU Note (Signed)
Patient asked for paper with dilation of cervix. Patient was 4cm on last exam and that was written on paper. Patient argumentative because she says she was told she was 4 1/2 cms. Informed patient that RN had to document current exam, not an exam by another provider.Patient came out of room, very belligerent.Talking loudly, went to the door of another patient and was talking loudly to them. RN apologized to family of other patient and attempted to close the door. Patient put her hand on the door and tried to prevent closing of door. Then started speaking very loudly about how disrespectful it was to shut the door when she was having a conversation with the family in the other room. The other family was not engaging her in conversation as she stated. Raquel SarnaLorinda Shaw, RN, [facility] was called who then called security. Security asked patient to leave. Patient continued to remain in MAU using loud voice. L. Clelia CroftShaw, RN arrived and patient agreed to leave MAU with her and security.

## 2015-11-22 NOTE — Progress Notes (Signed)
Notified of pt arrival in MAU and exam. Will let pt walk and recheck in another hour

## 2015-11-24 ENCOUNTER — Encounter (HOSPITAL_COMMUNITY): Payer: Self-pay | Admitting: *Deleted

## 2015-11-24 ENCOUNTER — Inpatient Hospital Stay (HOSPITAL_COMMUNITY)
Admission: AD | Admit: 2015-11-24 | Discharge: 2015-11-24 | Disposition: A | Discharge status: Home / Self Care | Payer: Medicaid Other | Source: Ambulatory Visit | Attending: Obstetrics and Gynecology | Admitting: Obstetrics and Gynecology

## 2015-11-24 DIAGNOSIS — O26893 Other specified pregnancy related conditions, third trimester: Secondary | ICD-10-CM | POA: Diagnosis not present

## 2015-11-24 DIAGNOSIS — Z3A37 37 weeks gestation of pregnancy: Secondary | ICD-10-CM | POA: Diagnosis not present

## 2015-11-24 DIAGNOSIS — N898 Other specified noninflammatory disorders of vagina: Secondary | ICD-10-CM | POA: Diagnosis present

## 2015-11-24 DIAGNOSIS — Z885 Allergy status to narcotic agent status: Secondary | ICD-10-CM | POA: Diagnosis not present

## 2015-11-24 DIAGNOSIS — Z87891 Personal history of nicotine dependence: Secondary | ICD-10-CM | POA: Insufficient documentation

## 2015-11-24 LAB — AMNISURE RUPTURE OF MEMBRANE (ROM) NOT AT ARMC: Amnisure ROM: NEGATIVE

## 2015-11-24 NOTE — MAU Note (Signed)
Thinks water broke
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
## 2015-11-25 ENCOUNTER — Ambulatory Visit (INDEPENDENT_AMBULATORY_CARE_PROVIDER_SITE_OTHER): Payer: Medicaid Other | Admitting: Obstetrics and Gynecology

## 2015-11-25 ENCOUNTER — Encounter: Payer: Self-pay | Admitting: Obstetrics and Gynecology

## 2015-11-25 VITALS — BP 110/62 | HR 93 | Wt 213.3 lb

## 2015-11-25 DIAGNOSIS — O360930 Maternal care for other rhesus isoimmunization, third trimester, not applicable or unspecified: Secondary | ICD-10-CM

## 2015-11-25 DIAGNOSIS — O09213 Supervision of pregnancy with history of pre-term labor, third trimester: Secondary | ICD-10-CM | POA: Diagnosis not present

## 2015-11-25 DIAGNOSIS — Z8751 Personal history of pre-term labor: Secondary | ICD-10-CM

## 2015-11-25 DIAGNOSIS — Z6791 Unspecified blood type, Rh negative: Secondary | ICD-10-CM

## 2015-11-25 DIAGNOSIS — O0993 Supervision of high risk pregnancy, unspecified, third trimester: Secondary | ICD-10-CM

## 2015-11-25 DIAGNOSIS — D573 Sickle-cell trait: Secondary | ICD-10-CM

## 2015-11-25 DIAGNOSIS — O99013 Anemia complicating pregnancy, third trimester: Secondary | ICD-10-CM | POA: Diagnosis not present

## 2015-11-25 LAB — WET PREP, GENITAL
Clue Cells Wet Prep HPF POC: NONE SEEN
Trich, Wet Prep: NONE SEEN

## 2015-11-25 MED ORDER — FLUCONAZOLE 150 MG PO TABS: 150.0000 mg | ORAL_TABLET | Freq: Once | ORAL | Status: DC

## 2015-11-25 NOTE — Progress Notes (Signed)
Subjective:  Jenna Harding is a 35 y.o. [id number] at 6950w6d being seen today for ongoing prenatal care.  She is currently monitored for the following issues for this high-risk pregnancy and has Obstetrical trauma, antepartum; Supervision of high-risk pregnancy; RhD negative; Sickle cell trait (HCC); and History of preterm delivery on her problem list.  Patient reports no complaints.  Contractions: Irregular. Vag. Bleeding: None.  Movement: Present. Denies leaking of fluid.   The following portions of the patient's history were reviewed and updated as appropriate: allergies, current medications, past family history, past medical history, past social history, past surgical history and problem list. Problem list updated.  Objective:   Filed Vitals:   11/25/15 1046  BP: 110/62  Pulse: 93  Weight: 213 lb 4.8 oz (96.752 kg)    Fetal Status: Fetal Heart Rate (bpm): 135 Fundal Height: 38 cm Movement: Present  Presentation: Vertex  General:  Alert, oriented and cooperative. Patient is in no acute distress.  Skin: Skin is warm and dry. No rash noted.   Cardiovascular: Normal heart rate noted  Respiratory: Normal respiratory effort, no problems with respiration noted  Abdomen: Soft, gravid, appropriate for gestational age. Pain/Pressure: Present     Pelvic: Vag. Bleeding: None Vag D/C Character: White   Cervical exam performed Dilation: 4 Effacement (%): 50 Station: -3  Extremities: Normal range of motion.  Edema: None  Mental Status: Normal mood and affect. Normal behavior. Normal judgment and thought content.   Urinalysis:      Assessment and Plan:  Pregnancy: [id number] at 7650w6d  1. Supervision of high-risk pregnancy, third trimester Patient is doing well without complaints  2. RhD negative S/p rhogam  3. History of preterm delivery   Term labor symptoms and general obstetric precautions including but not limited to vaginal bleeding, contractions, leaking of fluid and fetal movement were  reviewed in detail with the patient. Please refer to After Visit Summary for other counseling recommendations.  Return in about 1 week (around 12/02/2015).   Catalina AntiguaPeggy Aalia Greulich, MD

## 2015-11-25 NOTE — Addendum Note (Signed)
Addended by: Catalina AntiguaONSTANT, Chigozie Basaldua on: 11/25/2015 08:24 PM   Modules accepted: Orders

## 2015-11-26 ENCOUNTER — Telehealth: Payer: Self-pay | Admitting: *Deleted

## 2015-11-26 NOTE — Telephone Encounter (Signed)
Per Dr Jolayne Pantheronstant patient has yeast infection and diflucan has been sent to the pharmacy. Called patient and informed her of results and need to pick up rx, patient had no further questions.

## 2015-12-02 ENCOUNTER — Encounter: Payer: Self-pay | Admitting: Obstetrics and Gynecology

## 2015-12-02 ENCOUNTER — Ambulatory Visit (INDEPENDENT_AMBULATORY_CARE_PROVIDER_SITE_OTHER): Payer: Medicaid Other | Admitting: Obstetrics and Gynecology

## 2015-12-02 VITALS — BP 112/57 | HR 89 | Temp 98.6°F | Wt 213.0 lb

## 2015-12-02 DIAGNOSIS — Z6791 Unspecified blood type, Rh negative: Secondary | ICD-10-CM

## 2015-12-02 DIAGNOSIS — Z8751 Personal history of pre-term labor: Secondary | ICD-10-CM

## 2015-12-02 DIAGNOSIS — O0993 Supervision of high risk pregnancy, unspecified, third trimester: Secondary | ICD-10-CM

## 2015-12-02 DIAGNOSIS — O360930 Maternal care for other rhesus isoimmunization, third trimester, not applicable or unspecified: Secondary | ICD-10-CM

## 2015-12-02 DIAGNOSIS — O09213 Supervision of pregnancy with history of pre-term labor, third trimester: Secondary | ICD-10-CM | POA: Diagnosis present

## 2015-12-02 LAB — POCT URINALYSIS DIP (DEVICE)
Bilirubin Urine: NEGATIVE
Glucose, UA: 250 mg/dL — AB
Hgb urine dipstick: NEGATIVE
Ketones, ur: 15 mg/dL — AB
Leukocytes, UA: NEGATIVE
Nitrite: NEGATIVE
Protein, ur: 30 mg/dL — AB
Specific Gravity, Urine: >=1.03 (ref 1.005–1.030)
Urobilinogen, UA: 1 mg/dL (ref 0.0–1.0)
pH: 5.5 (ref 5.0–8.0)

## 2015-12-02 NOTE — Progress Notes (Signed)
Subjective:  Jenna Harding is a 35 y.o. [id number] at 749w6d being seen today for ongoing prenatal care.  She is currently monitored for the following issues for this low-risk pregnancy and has Obstetrical trauma, antepartum; Supervision of high-risk pregnancy; RhD negative; Sickle cell trait (HCC); and History of preterm delivery on her problem list.  Patient reports no complaints.  Contractions: Not present. Vag. Bleeding: None.  Movement: Present. Denies leaking of fluid.   The following portions of the patient's history were reviewed and updated as appropriate: allergies, current medications, past family history, past medical history, past social history, past surgical history and problem list. Problem list updated.  Objective:   Filed Vitals:   12/02/15 0954  BP: 112/57  Pulse: 89  Temp: 98.6 F (37 C)  Weight: 213 lb (96.616 kg)    Fetal Status: Fetal Heart Rate (bpm): 140 Fundal Height: 39 cm Movement: Present  Presentation: Vertex  General:  Alert, oriented and cooperative. Patient is in no acute distress.  Skin: Skin is warm and dry. No rash noted.   Cardiovascular: Normal heart rate noted  Respiratory: Normal respiratory effort, no problems with respiration noted  Abdomen: Soft, gravid, appropriate for gestational age. Pain/Pressure: Present     Pelvic: Vag. Bleeding: None     Cervical exam performed Dilation: 5 Effacement (%): 50 Station: -3  Extremities: Normal range of motion.  Edema: Trace  Mental Status: Normal mood and affect. Normal behavior. Normal judgment and thought content.   Urinalysis:      Assessment and Plan:  Pregnancy: [id number] at 419w6d  1. Supervision of high-risk pregnancy, third trimester Patient is doing well without complaints  2. History of preterm delivery   3. RhD negative   Term labor symptoms and general obstetric precautions including but not limited to vaginal bleeding, contractions, leaking of fluid and fetal movement were reviewed in  detail with the patient. Please refer to After Visit Summary for other counseling recommendations.  Return in about 1 week (around 12/09/2015).   Catalina AntiguaPeggy Davaughn Hillyard, MD

## 2015-12-04 ENCOUNTER — Telehealth: Payer: Self-pay

## 2015-12-04 ENCOUNTER — Inpatient Hospital Stay (HOSPITAL_COMMUNITY)
Admission: AD | Admit: 2015-12-04 | Discharge: 2015-12-04 | Disposition: A | Discharge status: Home / Self Care | Payer: Medicaid Other | Source: Ambulatory Visit | Attending: Obstetrics & Gynecology | Admitting: Obstetrics & Gynecology

## 2015-12-04 DIAGNOSIS — Z5320 Procedure and treatment not carried out because of patient's decision for unspecified reasons: Secondary | ICD-10-CM | POA: Insufficient documentation

## 2015-12-04 DIAGNOSIS — M62830 Muscle spasm of back: Secondary | ICD-10-CM | POA: Diagnosis present

## 2015-12-04 NOTE — MAU Note (Signed)
Pt reports muscle spasms, "hurting last night", lost mucus plug after stripping of the membranes on Monday

## 2015-12-04 NOTE — MAU Note (Addendum)
Has been having muscle spasms in back last couple days.  Feeling pressure,has been contracting off and on for 3 wks, causes stomach to ache.  loss of appetite, hadn't really eaten in 2 day, did force self to eat today.  Lack of energy.  Membranes were stripped on Mon, was 5 cm.  Hands and feet swell when she sits

## 2015-12-04 NOTE — Discharge Instructions (Signed)
Braxton Hicks Contractions °Contractions of the uterus can occur throughout pregnancy. Contractions are not always a sign that you are in labor.  °WHAT ARE BRAXTON HICKS CONTRACTIONS?  °Contractions that occur before labor are called Braxton Hicks contractions, or false labor. Toward the end of pregnancy (32-34 weeks), these contractions can develop more often and may become more forceful. This is not true labor because these contractions do not result in opening (dilatation) and thinning of the cervix. They are sometimes difficult to tell apart from true labor because these contractions can be forceful and people have different pain tolerances. You should not feel embarrassed if you go to the hospital with false labor. Sometimes, the only way to tell if you are in true labor is for your health care provider to look for changes in the cervix. °If there are no prenatal problems or other health problems associated with the pregnancy, it is completely safe to be sent home with false labor and await the onset of true labor. °HOW CAN YOU TELL THE DIFFERENCE BETWEEN TRUE AND FALSE LABOR? °False Labor °· The contractions of false labor are usually shorter and not as hard as those of true labor.   °· The contractions are usually irregular.   °· The contractions are often felt in the front of the lower abdomen and in the groin.   °· The contractions may go away when you walk around or change positions while lying down.   °· The contractions get weaker and are shorter lasting as time goes on.   °· The contractions do not usually become progressively stronger, regular, and closer together as with true labor.   °True Labor °· Contractions in true labor last 30-70 seconds, become very regular, usually become more intense, and increase in frequency.   °· The contractions do not go away with walking.   °· The discomfort is usually felt in the top of the uterus and spreads to the lower abdomen and low back.   °· True labor can be  determined by your health care provider with an exam. This will show that the cervix is dilating and getting thinner.   °WHAT TO REMEMBER °· Keep up with your usual exercises and follow other instructions given by your health care provider.   °· Take medicines as directed by your health care provider.   °· Keep your regular prenatal appointments.   °· Eat and drink lightly if you think you are going into labor.   °· If Braxton Hicks contractions are making you uncomfortable:   °¨ Change your position from lying down or resting to walking, or from walking to resting.   °¨ Sit and rest in a tub of warm water.   °¨ Drink 2-3 glasses of water. Dehydration may cause these contractions.   °¨ Do slow and deep breathing several times an hour.   °WHEN SHOULD I SEEK IMMEDIATE MEDICAL CARE? °Seek immediate medical care if: °· Your contractions become stronger, more regular, and closer together.   °· You have fluid leaking or gushing from your vagina.   °· You have a fever.   °· You pass blood-tinged mucus.   °· You have vaginal bleeding.   °· You have continuous abdominal pain.   °· You have low back pain that you never had before.   °· You feel your baby's head pushing down and causing pelvic pressure.   °· Your baby is not moving as much as it used to.   °  °This information is not intended to replace advice given to you by your health care provider. Make sure you discuss any questions you have with your health care   provider. °  °Document Released: 08/24/2005 Document Revised: 08/29/2013 Document Reviewed: 06/05/2013 °Elsevier Interactive Patient Education ©2016 [employer] ° °

## 2015-12-04 NOTE — Telephone Encounter (Signed)
Patient walked in the office today around 12:15 wants to speak to a nurse ASAP it is regarding her being checked again beofre her next appoitment, can someone please call her. Thanks!

## 2015-12-04 NOTE — MAU Note (Signed)
Pt left without signing discharge papers

## 2015-12-05 ENCOUNTER — Telehealth: Payer: Self-pay | Admitting: *Deleted

## 2015-12-05 NOTE — Telephone Encounter (Signed)
Patient called and said she was feeling back with a upset stomach due to eating raw meat yesterday, I asked a nurse what she should do and they advised her to go to MAU, I told the patient to go and she said she will.

## 2015-12-05 NOTE — Telephone Encounter (Signed)
Received a message from the front office staff that [name] wanted to speak with me.  Attempted to contact her via phone.  After 5-7 rings the phone is silent, no voicemail message and no one answers.  Immediately called patient again.  Phoned rings 3-4 times and message starts, says "You have reached the voicemail box of" then there is a click and the message says "The person you have dialed is unavailable.  Try your call again later."  Will try to contact patient again later.

## 2015-12-05 NOTE — Telephone Encounter (Signed)
Called pt in regards to in basket message. Pt informed me that she wanted to be scheduled for induction.  Pt went on to tell me that she was in MAU and they sent her home; that her baby's heart rate dropped at least five times.  After speaking with Dr. Jolayne Pantheronstant, I was advised that the pt knows that her concerns will be addressed at her prenatal appt that is scheduled for Monday.  Pt starts to yell and states "I am not coming to my appt on Monday because I had a death in my family so I just want to get my induction scheduled.  I advised pt Dr. Deretha Emoryonstant's recommendations, pt begins again to start yelling and states to me "your not listening I want my induction scheduled because I will be missing my appt on Monday and I don't want to end up like these other two women I know who was in labor and because there was no bed available had to wait".  She stated "I do not want that to happen to me, I want to get my induction scheduled so at least I know".  Pt then starts to inform me that she is not 39 weeks, I advised pt that she has already spoken to a provider in regards to her EDD.  I advised pt that if she continues to have questions to please ask the provider at her appt that she reschedules.  As pt begins to speak she states that she can not hear me and the telephone call disconnects.

## 2015-12-05 NOTE — Telephone Encounter (Signed)
Patient called in to front office stating she missed a call from us yesterday and the call was about her coming in for an appt. Told patient I do not see where someone has been trying to contact her with an appt. Patient states she had her membranes stripped on Monday and it didn't work well and that doctor that saw her hardly checked her. Patient states she hasn't been able to keep anything down for days and is having contractions every 20 minutes that are extremely painful but no one will do anything about it because her cervix isn't changing. Patient states she wants to come in to see someone today to have her membranes stripped again and her cervix checked. Discussed with patient that can be done at her appt on Monday and we do not have any appts for her to come in. Patient states she cannot wait until Monday because she is miserable and in severe pain. Patient states she will just go to MAU every day till Monday otherwise. Told patient if she feels she needs to be urgently seen then she can go to MAU. Patient states she needs an appt to come in today or tomorrow to have her cervix checked and membranes stripped. Patient's phone connection kept cutting in and out and the phone eventually cut off

## 2015-12-05 NOTE — Telephone Encounter (Signed)
Patient phoned back and asked to speak to someone who could help her.  Stated she didn't want to speak to a nurse.  Front office staff transferred patient to me.  I introduced myself and asked how I could help.  Patient states she has appointment scheduled for Monday but has had a death in the family and doesn't know if she will make her appointment.  States she wants to schedule an appointment for the following week because the providers have told her they will induce her at 41 weeks if she hasn't already delivered.  States she wants to know what date to expect induction because she is very overwhelmed with what is going on with her body.  States she was seen in MAU yesterday and her baby's heart rate dropped very low.  States the nurse gave her some juice and it came back up.  States she understands our providers have made a "vow" not to induce patients too early.  States she understands that things have changed and this is her 7th child.  States she is worried about her baby.  States she is very uncomfortable - difficulty breathing requiring her to sit down and can't sleep.  States she ate some raw food yesterday that made her sick.  States she is very concerned about her health and her baby.    I explained to patient when she called this morning and told us about eating the raw food we asked her to come to MAU for evaluation.  Patient states she came in yesterday.  I explained the notes say you left without being seen by a provider.  Patient states that is incorrect.  States the staff told her she was being discharged and could put on her clothes and they would bring her paperwork for her to sign on the computer.  I asked patient if she was still having vomiting and diarrhea as she had stated this morning during her call.  Patient states no vomiting, "more like acid reflux now".  I asked about diarrhea.  Patient states "it is still loose".  I explained to patient we were concerned she might be dehydrated due  to the vomiting and diarrhea and may need some IV fluids.  I asked her if she would come to MAU for evaluation.  Patient states she is waiting for a ride as she has so much swelling in her hands, arms, legs and feet "it hurts to turn the steering wheel".  Patient states she will come to MAU as soon as she can get someone to bring her this afternoon or this evening.  I asked patient about her appointment for Monday.  Patient states "I am going to do everything I can to be there".  States she is concerned about herself and her baby.  States she would like to have another appointment just in case she can't make her appointment for 12/09/15.  I explained to patient I could schedule her an appointment for the following week 12/16/15 at 9:05 am.  I explained when she meets with the provider on that day she will be 40 6/[redacted] weeks gestation.  I explained if the plan is to induce her at 41 weeks then the provider will give her a time and location of where to be on 12/17/15 for induction.  Patient seemed agreeable to this plan.  States she will try her very best to be at her appointment on 12/09/15 and if not she will be at her appointment on 12/16/15.  States understanding and thanked me for my time.

## 2015-12-06 ENCOUNTER — Inpatient Hospital Stay (HOSPITAL_COMMUNITY)
Admission: AD | Admit: 2015-12-06 | Discharge: 2015-12-06 | Disposition: A | Discharge status: Home / Self Care | Payer: Medicaid Other | Source: Ambulatory Visit | Attending: Obstetrics and Gynecology | Admitting: Obstetrics and Gynecology

## 2015-12-06 ENCOUNTER — Encounter (HOSPITAL_COMMUNITY): Payer: Self-pay | Admitting: *Deleted

## 2015-12-06 DIAGNOSIS — O212 Late vomiting of pregnancy: Secondary | ICD-10-CM | POA: Insufficient documentation

## 2015-12-06 DIAGNOSIS — K219 Gastro-esophageal reflux disease without esophagitis: Secondary | ICD-10-CM | POA: Diagnosis not present

## 2015-12-06 DIAGNOSIS — R197 Diarrhea, unspecified: Secondary | ICD-10-CM | POA: Diagnosis not present

## 2015-12-06 DIAGNOSIS — Z3A39 39 weeks gestation of pregnancy: Secondary | ICD-10-CM | POA: Diagnosis not present

## 2015-12-06 DIAGNOSIS — Z87891 Personal history of nicotine dependence: Secondary | ICD-10-CM | POA: Diagnosis not present

## 2015-12-06 DIAGNOSIS — N898 Other specified noninflammatory disorders of vagina: Secondary | ICD-10-CM | POA: Diagnosis not present

## 2015-12-06 DIAGNOSIS — O26893 Other specified pregnancy related conditions, third trimester: Secondary | ICD-10-CM | POA: Insufficient documentation

## 2015-12-06 DIAGNOSIS — O99613 Diseases of the digestive system complicating pregnancy, third trimester: Secondary | ICD-10-CM | POA: Diagnosis not present

## 2015-12-06 LAB — URINE MICROSCOPIC-ADD ON: RBC / HPF: NONE SEEN RBC/hpf (ref 0–5)

## 2015-12-06 LAB — URINALYSIS, ROUTINE W REFLEX MICROSCOPIC
Bilirubin Urine: NEGATIVE
Glucose, UA: NEGATIVE mg/dL
Hgb urine dipstick: NEGATIVE
Ketones, ur: NEGATIVE mg/dL
Nitrite: NEGATIVE
Protein, ur: NEGATIVE mg/dL
Specific Gravity, Urine: 1.025 (ref 1.005–1.030)
pH: 6 (ref 5.0–8.0)

## 2015-12-06 LAB — WET PREP, GENITAL
Clue Cells Wet Prep HPF POC: NONE SEEN
Sperm: NONE SEEN
Trich, Wet Prep: NONE SEEN
Yeast Wet Prep HPF POC: NONE SEEN

## 2015-12-06 LAB — POCT FERN TEST: POCT Fern Test: NEGATIVE

## 2015-12-06 MED ORDER — ONDANSETRON 8 MG PO TBDP
8.0000 mg | ORAL_TABLET | Freq: Once | ORAL | Status: AC
Start: 2015-12-06 — End: 2015-12-06
  Administered 2015-12-06: 8 mg via ORAL
  Filled 2015-12-06: qty 1

## 2015-12-06 NOTE — MAU Note (Signed)
Pt presents to MAU with complaints of nausea, vomiting, and diarrhea since yesterday. Pt denies any vaginal bleeding or LOF

## 2015-12-06 NOTE — Discharge Instructions (Signed)
Come to the MAU (maternity admission unit) for 1) Strong contractions every 2-3 minutes for at least 1 hour that do no go away when you drink water or take a warm shower. These contractions will be so strong all you can do is breath through them 2) Vaginal bleeding- anything more than spotting 3) Loss of fluid like you broke your water 4) Decreased movement of your baby  You were seen for nausea/vomiting/diarrhea. You can take Pepto or Immodium for diarrhea. Please continue to drink plenty of water.
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
## 2015-12-09 ENCOUNTER — Ambulatory Visit (INDEPENDENT_AMBULATORY_CARE_PROVIDER_SITE_OTHER): Payer: Medicaid Other | Admitting: Obstetrics & Gynecology

## 2015-12-09 VITALS — BP 119/62 | HR 71 | Temp 98.0°F | Wt 217.3 lb

## 2015-12-09 DIAGNOSIS — D573 Sickle-cell trait: Secondary | ICD-10-CM | POA: Diagnosis not present

## 2015-12-09 DIAGNOSIS — Z8751 Personal history of pre-term labor: Secondary | ICD-10-CM

## 2015-12-09 DIAGNOSIS — O99013 Anemia complicating pregnancy, third trimester: Secondary | ICD-10-CM

## 2015-12-09 DIAGNOSIS — O0993 Supervision of high risk pregnancy, unspecified, third trimester: Secondary | ICD-10-CM

## 2015-12-09 DIAGNOSIS — O360930 Maternal care for other rhesus isoimmunization, third trimester, not applicable or unspecified: Secondary | ICD-10-CM | POA: Diagnosis not present

## 2015-12-09 DIAGNOSIS — O09213 Supervision of pregnancy with history of pre-term labor, third trimester: Secondary | ICD-10-CM | POA: Diagnosis not present

## 2015-12-09 LAB — POCT URINALYSIS DIP (DEVICE)
Bilirubin Urine: NEGATIVE
Glucose, UA: 100 mg/dL — AB
Hgb urine dipstick: NEGATIVE
Ketones, ur: 15 mg/dL — AB
Nitrite: NEGATIVE
Protein, ur: NEGATIVE mg/dL
Specific Gravity, Urine: >=1.03 (ref 1.005–1.030)
Urobilinogen, UA: 1 mg/dL (ref 0.0–1.0)
pH: 5.5 (ref 5.0–8.0)

## 2015-12-09 LAB — GC/CHLAMYDIA PROBE AMP (~~LOC~~) NOT AT ARMC
Chlamydia: NEGATIVE
Neisseria Gonorrhea: NEGATIVE

## 2015-12-09 NOTE — Patient Instructions (Signed)
Return to clinic for any scheduled appointments or obstetric concerns, or go to MAU for evaluation  

## 2015-12-09 NOTE — Progress Notes (Signed)
IOL scheduled for 04/11 @ 730am.
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
## 2015-12-11 ENCOUNTER — Inpatient Hospital Stay (HOSPITAL_COMMUNITY)
Admission: AD | Admit: 2015-12-11 | Discharge: 2015-12-13 | DRG: 775 | Disposition: A | Discharge status: Home / Self Care | Payer: Medicaid Other | Source: Ambulatory Visit | Attending: Obstetrics & Gynecology | Admitting: Obstetrics & Gynecology

## 2015-12-11 ENCOUNTER — Encounter (HOSPITAL_COMMUNITY): Payer: Self-pay

## 2015-12-11 DIAGNOSIS — O9902 Anemia complicating childbirth: Secondary | ICD-10-CM | POA: Diagnosis present

## 2015-12-11 DIAGNOSIS — K219 Gastro-esophageal reflux disease without esophagitis: Secondary | ICD-10-CM | POA: Diagnosis present

## 2015-12-11 DIAGNOSIS — O26893 Other specified pregnancy related conditions, third trimester: Secondary | ICD-10-CM | POA: Diagnosis present

## 2015-12-11 DIAGNOSIS — O0943 Supervision of pregnancy with grand multiparity, third trimester: Secondary | ICD-10-CM

## 2015-12-11 DIAGNOSIS — M797 Fibromyalgia: Secondary | ICD-10-CM | POA: Diagnosis present

## 2015-12-11 DIAGNOSIS — Z3A40 40 weeks gestation of pregnancy: Secondary | ICD-10-CM | POA: Diagnosis not present

## 2015-12-11 DIAGNOSIS — Z6791 Unspecified blood type, Rh negative: Secondary | ICD-10-CM

## 2015-12-11 DIAGNOSIS — Z87891 Personal history of nicotine dependence: Secondary | ICD-10-CM | POA: Diagnosis not present

## 2015-12-11 DIAGNOSIS — O4202 Full-term premature rupture of membranes, onset of labor within 24 hours of rupture: Secondary | ICD-10-CM | POA: Diagnosis present

## 2015-12-11 DIAGNOSIS — O360930 Maternal care for other rhesus isoimmunization, third trimester, not applicable or unspecified: Secondary | ICD-10-CM | POA: Diagnosis not present

## 2015-12-11 DIAGNOSIS — O4212 Full-term premature rupture of membranes, onset of labor more than 24 hours following rupture: Secondary | ICD-10-CM

## 2015-12-11 DIAGNOSIS — D573 Sickle-cell trait: Secondary | ICD-10-CM | POA: Diagnosis present

## 2015-12-11 DIAGNOSIS — O4290 Premature rupture of membranes, unspecified as to length of time between rupture and onset of labor, unspecified weeks of gestation: Secondary | ICD-10-CM | POA: Diagnosis present

## 2015-12-11 DIAGNOSIS — O9962 Diseases of the digestive system complicating childbirth: Secondary | ICD-10-CM | POA: Diagnosis present

## 2015-12-11 DIAGNOSIS — O0940 Supervision of pregnancy with grand multiparity, unspecified trimester: Secondary | ICD-10-CM

## 2015-12-11 DIAGNOSIS — IMO0002 Reserved for concepts with insufficient information to code with codable children: Secondary | ICD-10-CM

## 2015-12-11 LAB — CBC
HCT: 30.3 % — ABNORMAL LOW (ref 36.0–46.0)
Hemoglobin: 9.7 g/dL — ABNORMAL LOW (ref 12.0–15.0)
MCH: 26.9 pg (ref 26.0–34.0)
MCHC: 32 g/dL (ref 30.0–36.0)
MCV: 84.2 fL (ref 78.0–100.0)
Platelets: 269 10*3/uL (ref 150–400)
RBC: 3.6 MIL/uL — ABNORMAL LOW (ref 3.87–5.11)
RDW: 15.5 % (ref 11.5–15.5)
WBC: 11.8 10*3/uL — ABNORMAL HIGH (ref 4.0–10.5)

## 2015-12-11 LAB — RAPID URINE DRUG SCREEN, HOSP PERFORMED
Amphetamines: NOT DETECTED
Barbiturates: NOT DETECTED
Benzodiazepines: NOT DETECTED
Cocaine: NOT DETECTED
Opiates: NOT DETECTED
Tetrahydrocannabinol: NOT DETECTED

## 2015-12-11 LAB — RPR: RPR Ser Ql: NONREACTIVE

## 2015-12-11 LAB — POCT FERN TEST: POCT Fern Test: POSITIVE

## 2015-12-11 MED ORDER — DIPHENHYDRAMINE HCL 25 MG PO CAPS
25.0000 mg | ORAL_CAPSULE | Freq: Four times a day (QID) | ORAL | Status: DC | PRN
  Administered 2015-12-11 – 2015-12-13 (×5): 25 mg via ORAL
  Filled 2015-12-11 (×5): qty 1

## 2015-12-11 MED ORDER — ZOLPIDEM TARTRATE 5 MG PO TABS: 5.0000 mg | ORAL_TABLET | Freq: Every evening | ORAL | Status: DC | PRN

## 2015-12-11 MED ORDER — WITCH HAZEL-GLYCERIN EX PADS: 1.0000 "application " | MEDICATED_PAD | CUTANEOUS | Status: DC | PRN

## 2015-12-11 MED ORDER — DIBUCAINE 1 % RE OINT
1.0000 "application " | TOPICAL_OINTMENT | RECTAL | Status: DC | PRN
  Filled 2015-12-11: qty 28

## 2015-12-11 MED ORDER — EPHEDRINE 5 MG/ML INJ
10.0000 mg | INTRAVENOUS | Status: DC | PRN
  Filled 2015-12-11: qty 2

## 2015-12-11 MED ORDER — TETANUS-DIPHTH-ACELL PERTUSSIS 5-2.5-18.5 LF-MCG/0.5 IM SUSP
0.5000 mL | Freq: Once | INTRAMUSCULAR | Status: DC
  Filled 2015-12-11: qty 0.5

## 2015-12-11 MED ORDER — OXYTOCIN 10 UNIT/ML IJ SOLN
1.0000 m[IU]/min | INTRAVENOUS | Status: DC
  Administered 2015-12-11: 2 m[IU]/min via INTRAVENOUS
  Filled 2015-12-11: qty 4

## 2015-12-11 MED ORDER — ONDANSETRON HCL 4 MG/2ML IJ SOLN: 4.0000 mg | Freq: Four times a day (QID) | INTRAMUSCULAR | Status: DC | PRN

## 2015-12-11 MED ORDER — ONDANSETRON HCL 4 MG PO TABS: 4.0000 mg | ORAL_TABLET | ORAL | Status: DC | PRN

## 2015-12-11 MED ORDER — FENTANYL CITRATE (PF) 100 MCG/2ML IJ SOLN
50.0000 ug | INTRAMUSCULAR | Status: DC | PRN
  Administered 2015-12-11: 100 ug via INTRAVENOUS
  Filled 2015-12-11: qty 2

## 2015-12-11 MED ORDER — BENZOCAINE-MENTHOL 20-0.5 % EX AERO
1.0000 "application " | INHALATION_SPRAY | CUTANEOUS | Status: DC | PRN
  Administered 2015-12-11: 1 via TOPICAL
  Filled 2015-12-11 (×2): qty 56

## 2015-12-11 MED ORDER — LACTATED RINGERS IV SOLN
INTRAVENOUS | Status: DC
Start: 2015-12-11 — End: 2015-12-11
  Administered 2015-12-11 (×2): via INTRAVENOUS

## 2015-12-11 MED ORDER — PHENYLEPHRINE 40 MCG/ML (10ML) SYRINGE FOR IV PUSH (FOR BLOOD PRESSURE SUPPORT)
80.0000 ug | PREFILLED_SYRINGE | INTRAVENOUS | Status: DC | PRN
  Filled 2015-12-11: qty 20
  Filled 2015-12-11: qty 2

## 2015-12-11 MED ORDER — ACETAMINOPHEN 325 MG PO TABS
650.0000 mg | ORAL_TABLET | ORAL | Status: DC | PRN
  Administered 2015-12-11: 650 mg via ORAL
  Filled 2015-12-11: qty 2

## 2015-12-11 MED ORDER — LANOLIN HYDROUS EX OINT: TOPICAL_OINTMENT | CUTANEOUS | Status: DC | PRN

## 2015-12-11 MED ORDER — LACTATED RINGERS IV SOLN: 500.0000 mL | Freq: Once | INTRAVENOUS | Status: DC

## 2015-12-11 MED ORDER — IBUPROFEN 600 MG PO TABS
600.0000 mg | ORAL_TABLET | Freq: Four times a day (QID) | ORAL | Status: DC
Start: 2015-12-11 — End: 2015-12-13
  Administered 2015-12-11 – 2015-12-13 (×9): 600 mg via ORAL
  Filled 2015-12-11 (×9): qty 1

## 2015-12-11 MED ORDER — OXYTOCIN 10 UNIT/ML IJ SOLN
INTRAMUSCULAR | Status: AC
  Filled 2015-12-11: qty 1

## 2015-12-11 MED ORDER — CITRIC ACID-SODIUM CITRATE 334-500 MG/5ML PO SOLN: 30.0000 mL | ORAL | Status: DC | PRN

## 2015-12-11 MED ORDER — FLEET ENEMA 7-19 GM/118ML RE ENEM: 1.0000 | ENEMA | RECTAL | Status: DC | PRN

## 2015-12-11 MED ORDER — ONDANSETRON HCL 4 MG/2ML IJ SOLN: 4.0000 mg | INTRAMUSCULAR | Status: DC | PRN

## 2015-12-11 MED ORDER — SENNOSIDES-DOCUSATE SODIUM 8.6-50 MG PO TABS
2.0000 | ORAL_TABLET | ORAL | Status: DC
Start: 2015-12-12 — End: 2015-12-13
  Administered 2015-12-11 – 2015-12-13 (×2): 2 via ORAL
  Filled 2015-12-11 (×2): qty 2

## 2015-12-11 MED ORDER — OXYTOCIN 10 UNIT/ML IJ SOLN: 2.5000 [IU]/h | INTRAVENOUS | Status: DC

## 2015-12-11 MED ORDER — FENTANYL 2.5 MCG/ML BUPIVACAINE 1/10 % EPIDURAL INFUSION (WH - ANES)
14.0000 mL/h | INTRAMUSCULAR | Status: DC | PRN
  Filled 2015-12-11: qty 125

## 2015-12-11 MED ORDER — PRENATAL MULTIVITAMIN CH
1.0000 | ORAL_TABLET | Freq: Every day | ORAL | Status: DC
  Administered 2015-12-12 – 2015-12-13 (×2): 1 via ORAL
  Filled 2015-12-11 (×3): qty 1

## 2015-12-11 MED ORDER — OXYCODONE-ACETAMINOPHEN 5-325 MG PO TABS
1.0000 | ORAL_TABLET | ORAL | Status: DC | PRN
  Administered 2015-12-11 – 2015-12-13 (×5): 2 via ORAL
  Filled 2015-12-11 (×5): qty 2

## 2015-12-11 MED ORDER — PHENYLEPHRINE 40 MCG/ML (10ML) SYRINGE FOR IV PUSH (FOR BLOOD PRESSURE SUPPORT)
80.0000 ug | PREFILLED_SYRINGE | INTRAVENOUS | Status: DC | PRN
Start: 2015-12-11 — End: 2015-12-11
  Filled 2015-12-11: qty 2

## 2015-12-11 MED ORDER — TERBUTALINE SULFATE 1 MG/ML IJ SOLN
0.2500 mg | Freq: Once | INTRAMUSCULAR | Status: DC | PRN
  Filled 2015-12-11: qty 1

## 2015-12-11 MED ORDER — DIPHENHYDRAMINE HCL 50 MG/ML IJ SOLN: 12.5000 mg | INTRAMUSCULAR | Status: DC | PRN

## 2015-12-11 MED ORDER — ACETAMINOPHEN 325 MG PO TABS: 650.0000 mg | ORAL_TABLET | ORAL | Status: DC | PRN

## 2015-12-11 MED ORDER — LACTATED RINGERS IV SOLN: 500.0000 mL | INTRAVENOUS | Status: DC | PRN

## 2015-12-11 MED ORDER — SIMETHICONE 80 MG PO CHEW: 80.0000 mg | CHEWABLE_TABLET | ORAL | Status: DC | PRN

## 2015-12-11 MED ORDER — LIDOCAINE HCL (PF) 1 % IJ SOLN
30.0000 mL | INTRAMUSCULAR | Status: DC | PRN
  Filled 2015-12-11: qty 30

## 2015-12-11 MED ORDER — OXYTOCIN BOLUS FROM INFUSION: 500.0000 mL | INTRAVENOUS | Status: DC

## 2015-12-11 NOTE — Progress Notes (Signed)
Provider called and informed of patients wishes to leave and go to another hospital--pt states that she does not like the laboring rooms here or beds and wants to go elsewhere--provider aware

## 2015-12-11 NOTE — Progress Notes (Signed)
Pt complains of pressure SVE 9cm offered epidural pt refused

## 2015-12-11 NOTE — Lactation Note (Signed)
This note was copied from a baby's chart. Lactation Consultation Note  Patient Name: Jenna Harding'UToday's Date: 12/11/2015 Reason for consult: Initial assessment Baby at 3 hr of life and mom is resting. Left handouts and mom should call at next feeding for lactation support.   Maternal Data    Feeding Feeding Type: Bottle Fed - Formula Nipple Type: Slow - flow Length of feed: 20 min  LATCH Score/Interventions Latch: Grasps breast easily, tongue down, lips flanged, rhythmical sucking.  Audible Swallowing: Spontaneous and intermittent  Type of Nipple: Everted at rest and after stimulation  Comfort (Breast/Nipple): Soft / non-tender     Hold (Positioning): No assistance needed to correctly position infant at breast.  LATCH Score: 10  Lactation Tools Discussed/Used     Consult Status Consult Status: Follow-up Date: 12/11/15 Follow-up type: In-patient    Jenna Harding 12/11/2015, 4:48 PM
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
## 2015-12-11 NOTE — Progress Notes (Signed)
Patient refused to go to the bathroom.  Patient stated she will go when she wants, we cannot make her go.  Explained to patient that empting her bladder will help her pain and bleeding.  Then discussed with Alcario Droughtrica the need to keep a light on so that she and the RNs could see the baby if we entered the room.  She complained and fussed that she was not going to leave a light on that, I could not make her do anything. Again I went over safety about the baby.

## 2015-12-11 NOTE — Lactation Note (Signed)
This note was copied from a baby's chart. Lactation Consultation Note  Patient Name: Jenna Harding HYQMV'HToday's Date: 12/11/2015 Reason for consult: Follow-up assessment Baby at 7 hr of life and mom was offering formula upon entry. She stated that her milk is not "shooting" out yet so she needs formula. Tried to discuss baby belly size, milk transition, feeding frequency, wt loss, and voids but mom was not listening. She cut me off several times saying that she has bf her other children. She stated that she has taken multiple bf classes and knows "how to feed a baby".   Maternal Data    Feeding    LATCH Score/Interventions                      Lactation Tools Discussed/Used     Consult Status Consult Status: Follow-up Date: 12/12/15 Follow-up type: In-patient    Jenna Harding 12/11/2015, 8:48 PM

## 2015-12-11 NOTE — Progress Notes (Signed)
Provider talked with pt--wishes to continue to induction and stay

## 2015-12-11 NOTE — MAU Note (Signed)
Pt here with c/o contractions since about 2300; has had some PTL, received steriods about 32 weeks; reports positive fetal movement.

## 2015-12-12 MED ORDER — HYDROXYZINE HCL 25 MG PO TABS
25.0000 mg | ORAL_TABLET | Freq: Once | ORAL | Status: AC
  Administered 2015-12-12: 25 mg via ORAL
  Filled 2015-12-12: qty 1

## 2015-12-12 NOTE — Progress Notes (Signed)
Post Partum Day 1 Subjective: up ad lib, voiding, tolerating PO and + flatus. Pt states she is having some left hip pain and lower abdominal pain.  Objective: Blood pressure 91/48, pulse 75, temperature 97.6 F (36.4 C), temperature source Oral, resp. rate 18, height 5\' 9"  (1.753 m), weight 217 lb (98.431 kg), last menstrual period 01/30/2015, SpO2 100 %, unknown if currently breastfeeding.  Physical Exam:  General: alert and no distress Lochia: appropriate Uterine Fundus: firm Incision: n/a DVT Evaluation: No evidence of DVT seen on physical exam.   Recent Labs  12/11/15 0525  HGB 9.7*  HCT 30.3*    Assessment/Plan: Plan for discharge tomorrow, Breastfeeding and Social Work consult   LOS: 1 day   Hilton SinclairKaty D Mayo 12/12/2015, 1:03 PM   OB fellow attestation:  I have seen and examined this patient; I agree with above documentation in the resident's note.   Federico FlakeKimberly Niles Aramis Zobel, MD 9:07 PM
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
## 2015-12-13 MED ORDER — CYCLOBENZAPRINE HCL 5 MG PO TABS: 5.0000 mg | ORAL_TABLET | Freq: Three times a day (TID) | ORAL | Status: DC | PRN

## 2015-12-13 MED ORDER — RHO D IMMUNE GLOBULIN 1500 UNIT/2ML IJ SOSY
300.0000 ug | PREFILLED_SYRINGE | Freq: Once | INTRAMUSCULAR | Status: AC
  Administered 2015-12-13: 300 ug via INTRAMUSCULAR
  Filled 2015-12-13: qty 2

## 2015-12-13 MED ORDER — IBUPROFEN 600 MG PO TABS: 600.0000 mg | ORAL_TABLET | Freq: Four times a day (QID) | ORAL | Status: DC

## 2015-12-13 NOTE — Progress Notes (Signed)
CSW informed that Abelardo Diesel is the assigned CPS worker [phone]).    CPS worker contacted CSW in order to receive supplemental information, and was informed of MOB and infant's medical readiness for discharge.  CPS stated that they will be at the hospital shortly in order to meet with MOB.    CSW contacted at approximately 12:50pm by RN, reporting that MOB was increasingly becoming more agitated and threatening to leave.  CSW also informed that CPS was in MOB's room, and was verbally frustrated with CPS involvement.    CSW consulted with CPS after meeting with MOB.  CPS reported that infant is able to be discharged to the care of the MOB.  CPS stated that she will be continuing to work closely with MOB and her family to ensure that the home is safe and all basic needs are met.  CPS to follow up with MOB in her home, but denied need to complete home study prior to discharge.   CSW to make [facility] referral, per MOB's request.

## 2015-12-13 NOTE — Lactation Note (Signed)
This note was copied from a baby's chart. Lactation Consultation Note  Patient Name: Girl Rosita Kearica Molder ZOXWR'UToday's Date: 12/13/2015 Reason for consult: Follow-up assessment Per RN, Mom declines LC services.   Maternal Data    Feeding    LATCH Score/Interventions                      Lactation Tools Discussed/Used     Consult Status Consult Status: Complete Date: 12/13/15 Follow-up type: In-patient    Alfred LevinsGranger, Kawthar Ennen Ann 12/13/2015, 10:39 AM

## 2015-12-13 NOTE — Discharge Instructions (Signed)
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
## 2015-12-14 LAB — TYPE AND SCREEN
ABO/RH(D): A NEG
Antibody Screen: POSITIVE
DAT, IgG: NEGATIVE
Unit division: 0
Unit division: 0

## 2015-12-14 LAB — RH IG WORKUP (INCLUDES ABO/RH)
ABO/RH(D): A NEG
Fetal Screen: NEGATIVE
Gestational Age(Wks): 40.1
Unit division: 0

## 2015-12-16 ENCOUNTER — Encounter: Payer: Medicaid Other | Admitting: Family Medicine

## 2015-12-16 ENCOUNTER — Telehealth: Payer: Self-pay

## 2015-12-16 ENCOUNTER — Encounter (HOSPITAL_COMMUNITY): Payer: Self-pay | Admitting: *Deleted
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
## 2015-12-17 ENCOUNTER — Inpatient Hospital Stay (HOSPITAL_COMMUNITY)
Admission: AD | Admit: 2015-12-17 | Discharge: 2015-12-17 | Disposition: A | Discharge status: Home / Self Care | Payer: Medicaid Other | Source: Ambulatory Visit | Attending: Family Medicine | Admitting: Family Medicine

## 2015-12-17 ENCOUNTER — Encounter (HOSPITAL_COMMUNITY): Payer: Self-pay | Admitting: *Deleted

## 2015-12-17 ENCOUNTER — Inpatient Hospital Stay (HOSPITAL_COMMUNITY): Admission: RE | Admit: 2015-12-17 | Payer: Medicaid Other | Source: Ambulatory Visit

## 2015-12-17 DIAGNOSIS — O1205 Gestational edema, complicating the puerperium: Secondary | ICD-10-CM | POA: Diagnosis not present

## 2015-12-17 DIAGNOSIS — M797 Fibromyalgia: Secondary | ICD-10-CM | POA: Diagnosis not present

## 2015-12-17 DIAGNOSIS — K219 Gastro-esophageal reflux disease without esophagitis: Secondary | ICD-10-CM | POA: Diagnosis not present

## 2015-12-17 DIAGNOSIS — R600 Localized edema: Secondary | ICD-10-CM | POA: Diagnosis present

## 2015-12-17 DIAGNOSIS — Z87891 Personal history of nicotine dependence: Secondary | ICD-10-CM | POA: Insufficient documentation

## 2015-12-17 LAB — BASIC METABOLIC PANEL
Anion gap: 6 (ref 5–15)
BUN: 17 mg/dL (ref 6–20)
CO2: 26 mmol/L (ref 22–32)
Calcium: 8.9 mg/dL (ref 8.9–10.3)
Chloride: 109 mmol/L (ref 101–111)
Creatinine, Ser: 0.65 mg/dL (ref 0.44–1.00)
GFR calc Af Amer: >60 mL/min (ref >60)
GFR calc non Af Amer: >60 mL/min (ref >60)
Glucose, Bld: 86 mg/dL (ref 65–99)
Potassium: 4.3 mmol/L (ref 3.5–5.1)
Sodium: 141 mmol/L (ref 135–145)

## 2015-12-17 LAB — CBC
HCT: 30.5 % — ABNORMAL LOW (ref 36.0–46.0)
Hemoglobin: 9.6 g/dL — ABNORMAL LOW (ref 12.0–15.0)
MCH: 27 pg (ref 26.0–34.0)
MCHC: 31.5 g/dL (ref 30.0–36.0)
MCV: 85.7 fL (ref 78.0–100.0)
Platelets: 307 10*3/uL (ref 150–400)
RBC: 3.56 MIL/uL — ABNORMAL LOW (ref 3.87–5.11)
RDW: 16 % — ABNORMAL HIGH (ref 11.5–15.5)
WBC: 7.2 10*3/uL (ref 4.0–10.5)

## 2015-12-17 MED ORDER — ACETAMINOPHEN 500 MG PO TABS: 1000.0000 mg | ORAL_TABLET | Freq: Three times a day (TID) | ORAL | Status: DC | PRN

## 2015-12-17 MED ORDER — OXYCODONE-ACETAMINOPHEN 5-325 MG PO TABS: ORAL_TABLET | ORAL | Status: DC

## 2015-12-17 NOTE — MAU Note (Addendum)
Patient instr'd to collect a clean catch urine specimen but states that when she went to the bathroom to void, she forgot to catch the urine in the specimen container as instr'd. Given apple juice po.

## 2015-12-17 NOTE — MAU Note (Signed)
Patient asked to again try to collect a urine specimen. Patient states that she does not have to urinate.
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
## 2015-12-17 NOTE — Telephone Encounter (Signed)
Called patient and spoke with her. Patient stated she was swollen all over her body, that the swelling had started prior to having her baby and has continued to worsen. Stated her feet felt and looked like they were about to pop and when she presses with her finger it leaves a dent. Patient was short of breath when speaking, i asked her had she been short of breath and she stated that she had, that she felt like when she was pregnant and couldn't get a deep breath in. Also stated that her bleeding was worse with this postpartum period than it was with her previous pregnancies. Stated the bleeding is not continuous but she gets "gushes" of blood and has to change her pad several times a day, though she could not tell me how often. Stated she is also lightheaded. I told her that with all the symptoms she is having she would best go to MAU to be evaluated. Patient said that she would but she needed to get someone to care for her older child and baby. Stated she would go to MAU as soon as she could.

## 2015-12-17 NOTE — Discharge Instructions (Signed)
Positioning: Edema Control    Keep leg elevated whenever possible to prevent swelling. ____ hours each session ____ times a day  Copyright  VHI. All rights reserved.

## 2015-12-17 NOTE — MAU Note (Signed)
Vag delivery. Body is swollen, seems to have gotten worse.  Body hurts, is very dizzy and light headed.  Bleeding is different, not like a cycle, it gushes out.

## 2015-12-18 ENCOUNTER — Ambulatory Visit: Payer: Medicaid Other

## 2015-12-25 ENCOUNTER — Ambulatory Visit: Payer: Medicaid Other

## 2016-01-29 ENCOUNTER — Ambulatory Visit: Payer: Medicaid Other | Admitting: Obstetrics and Gynecology

## 2016-02-05 ENCOUNTER — Inpatient Hospital Stay (HOSPITAL_COMMUNITY)
Admission: AD | Admit: 2016-02-05 | Discharge: 2016-02-05 | Disposition: A | Discharge status: Home / Self Care | Payer: Medicaid Other | Source: Ambulatory Visit | Attending: Obstetrics & Gynecology | Admitting: Obstetrics & Gynecology

## 2016-02-05 ENCOUNTER — Encounter (HOSPITAL_COMMUNITY): Payer: Self-pay | Admitting: *Deleted

## 2016-02-05 DIAGNOSIS — K5900 Constipation, unspecified: Secondary | ICD-10-CM

## 2016-02-05 DIAGNOSIS — R109 Unspecified abdominal pain: Secondary | ICD-10-CM | POA: Diagnosis present

## 2016-02-05 DIAGNOSIS — Z87891 Personal history of nicotine dependence: Secondary | ICD-10-CM | POA: Insufficient documentation

## 2016-02-05 DIAGNOSIS — K640 First degree hemorrhoids: Secondary | ICD-10-CM | POA: Diagnosis not present

## 2016-02-05 LAB — URINALYSIS, ROUTINE W REFLEX MICROSCOPIC
Bilirubin Urine: NEGATIVE
Glucose, UA: NEGATIVE mg/dL
Hgb urine dipstick: NEGATIVE
Ketones, ur: NEGATIVE mg/dL
Leukocytes, UA: NEGATIVE
Nitrite: NEGATIVE
Protein, ur: NEGATIVE mg/dL
Specific Gravity, Urine: 1.01 (ref 1.005–1.030)
pH: 6.5 (ref 5.0–8.0)

## 2016-02-05 LAB — POCT PREGNANCY, URINE: Preg Test, Ur: NEGATIVE

## 2016-02-05 MED ORDER — HYDROCORTISONE ACE-PRAMOXINE 1-1 % RE FOAM
1.0000 | Freq: Once | RECTAL | Status: DC
  Filled 2016-02-05: qty 10

## 2016-02-05 MED ORDER — LIDOCAINE HCL 2 % EX GEL
1.0000 "application " | Freq: Once | CUTANEOUS | Status: AC
  Administered 2016-02-05: 1 via TOPICAL
  Filled 2016-02-05: qty 5

## 2016-02-05 NOTE — MAU Note (Signed)
Pt presents to MAU with complaints of lower abdominal cramping with hemorrhoids. Pt delivered vaginally 12/11/15.
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
## 2016-03-02 ENCOUNTER — Ambulatory Visit (INDEPENDENT_AMBULATORY_CARE_PROVIDER_SITE_OTHER): Payer: Medicaid Other | Admitting: Obstetrics and Gynecology

## 2016-03-02 ENCOUNTER — Encounter: Payer: Self-pay | Admitting: Obstetrics and Gynecology

## 2016-03-02 ENCOUNTER — Other Ambulatory Visit (HOSPITAL_COMMUNITY)
Admission: RE | Admit: 2016-03-02 | Discharge: 2016-03-02 | Disposition: A | Discharge status: Home / Self Care | Payer: Medicaid Other | Source: Ambulatory Visit | Attending: Obstetrics and Gynecology | Admitting: Obstetrics and Gynecology

## 2016-03-02 DIAGNOSIS — Z3201 Encounter for pregnancy test, result positive: Secondary | ICD-10-CM | POA: Diagnosis not present

## 2016-03-02 DIAGNOSIS — Z113 Encounter for screening for infections with a predominantly sexual mode of transmission: Secondary | ICD-10-CM | POA: Insufficient documentation

## 2016-03-02 DIAGNOSIS — Z1151 Encounter for screening for human papillomavirus (HPV): Secondary | ICD-10-CM | POA: Diagnosis present

## 2016-03-02 DIAGNOSIS — Z01419 Encounter for gynecological examination (general) (routine) without abnormal findings: Secondary | ICD-10-CM | POA: Insufficient documentation

## 2016-03-02 DIAGNOSIS — Z3042 Encounter for surveillance of injectable contraceptive: Secondary | ICD-10-CM

## 2016-03-02 LAB — POCT PREGNANCY, URINE: Preg Test, Ur: NEGATIVE

## 2016-03-02 MED ORDER — MEDROXYPROGESTERONE ACETATE 150 MG/ML IM SUSP
150.0000 mg | Freq: Once | INTRAMUSCULAR | Status: AC
  Administered 2016-03-02: 150 mg via INTRAMUSCULAR

## 2016-03-02 MED ORDER — MEDROXYPROGESTERONE ACETATE 150 MG/ML IM SUSP: 150.0000 mg | INTRAMUSCULAR | Status: AC

## 2016-03-02 NOTE — Addendum Note (Signed)
Addended by: Cheree DittoGRAHAM, Kao Conry A on: 03/02/2016 01:32 PM   Modules accepted: Orders

## 2016-03-02 NOTE — Progress Notes (Signed)
Patient ID: Jenna Harding, female   DOB: 04/03/1981, 35 y.o.   MRN: [id number] Subjective:     Jenna Kearica Aytes is a 35 y.o. female who presents for a postpartum visit. She is 10 wks postpartum following a 12/11/2015. I have fully reviewed the prenatal and intrapartum course. The delivery was at 40 gestational weeks. Outcome: Vaginal. Anesthesia: normal. Postpartum course has been uncomplicated. Baby's course has been unco. Baby is feeding by breast/bottle feeding. Bleeding some spotting at this time. Bowel function is normal. Bladder function is normal. Patient is sexually active. Contraception method is depo-provera. Postpartum depression screening: negative. Wants depo. Condom broke one week ago. upt negative today.  Shoulder dystocia noted in ob history.   The following portions of the patient's history were reviewed and updated as appropriate: allergies, current medications, past family history, past medical history, past social history, past surgical history and problem list.  Review of Systems Pertinent items are noted in HPI.   Objective:    BP 110/73 mmHg  Pulse 63  Wt 205 lb 3.2 oz (93.078 kg)  General:  alert, cooperative and appears stated age     Lungs: clear to auscultation bilaterally  Heart:  regular rate and rhythm, S1, S2 normal, no murmur, click, rub or gallop  Abdomen: soft, non-tender; bowel sounds normal; no masses,  no organomegaly   Vulva:  normal  Vagina: normal vagina  Cervix:  normal                 Assessment:     normal postpartum exam. Pap smear done at today's visit.   Plan:    1. Contraception: Depo-Provera injections. Given today. Condom broke one week ago, upt negative. Pt warned of theoretical teratogenicity risk and opts to obtain today. i advised upt at home in 2 weeks to further exclude pregnancy 2. Std exposure: g, c, rpr, hiv 3. Normal ppd screen, breastfeeding going well 4. Vaginal discharge: wet prep, g, c F/u prn

## 2016-03-03 LAB — GC/CHLAMYDIA PROBE AMP (~~LOC~~) NOT AT ARMC
Chlamydia: NEGATIVE
Neisseria Gonorrhea: NEGATIVE

## 2016-03-03 LAB — HIV ANTIBODY (ROUTINE TESTING W REFLEX): HIV 1&2 Ab, 4th Generation: NONREACTIVE

## 2016-03-03 LAB — HEPATITIS B SURFACE ANTIGEN: Hepatitis B Surface Ag: NEGATIVE

## 2016-03-03 LAB — RPR

## 2016-03-03 LAB — CYTOLOGY - PAP

## 2016-03-05 LAB — WET PREP, GENITAL
Trich, Wet Prep: NONE SEEN
Yeast Wet Prep HPF POC: NONE SEEN

## 2016-03-05 MED ORDER — METRONIDAZOLE 0.75 % VA GEL: 1.0000 | Freq: Every day | VAGINAL | Status: AC

## 2016-03-05 NOTE — Addendum Note (Signed)
Addended by: Shonna ChockWOUK, Nora Rooke B on: 03/05/2016 03:19 PM   Modules accepted: Orders

## 2016-03-06 ENCOUNTER — Telehealth: Payer: Self-pay | Admitting: *Deleted

## 2016-03-06 NOTE — Telephone Encounter (Signed)
-----   [facility] W[facility] sent at 03/05/2016  3:00 PM EDT ----- Please call patient and share normal labs, normal pap, except has bv. metrogel script sent to her pharmacy. Thanks, Anette RiedelNoah  ----- Message -----    From: [facility] in [facility] Interface    Sent: 03/03/2016   6:03 AM      To: Kathrynn RunningNoah Bedford Wouk, MD

## 2016-03-06 NOTE — Telephone Encounter (Signed)
Called patient and left message to call us back for results.  

## 2016-03-12 NOTE — Telephone Encounter (Signed)
Patient called and left message stating she is returning our call. 

## 2016-03-18 NOTE — Telephone Encounter (Signed)
Called pt and informed her of normal labs, normal Pap and +BV. Her prescription has been sent to the pharmacy. Dosing instructions given. Pt voiced understanding of all information and instructions given.

## 2017-11-02 IMAGING — US US MFM FETAL BPP W/O NON-STRESS
1 series · 10 of 10 positions shown · non-contrast
Comparison: none

[Series 1: us mfm fetal bpp w/o non-stress · 10 acquisitions, 10 frames shown]
[im 1/10]
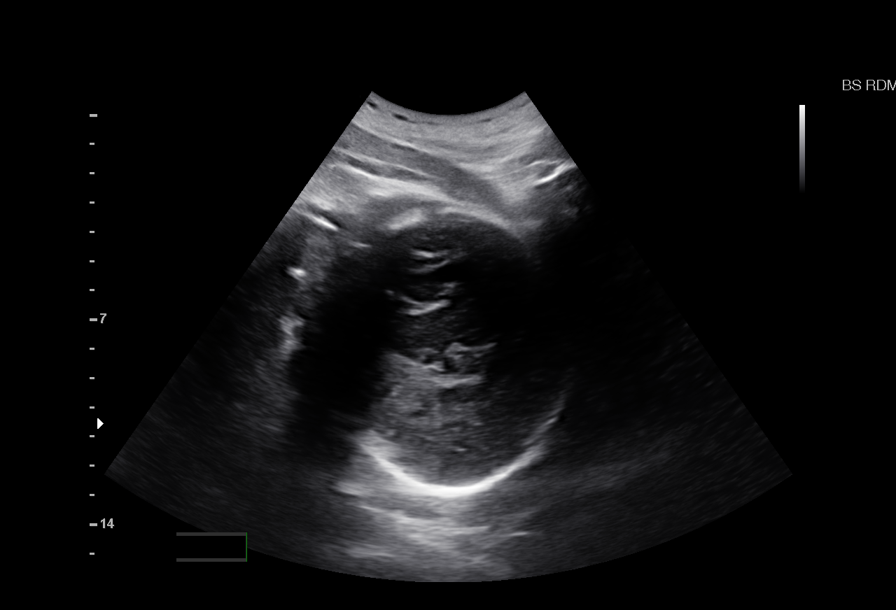
[im 2/10]
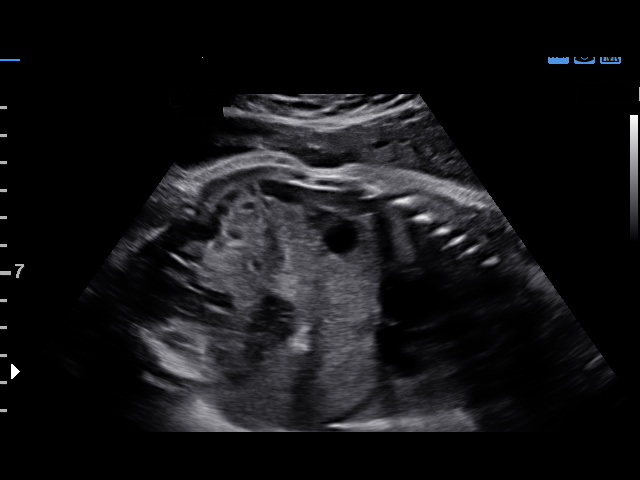
[im 3/10]
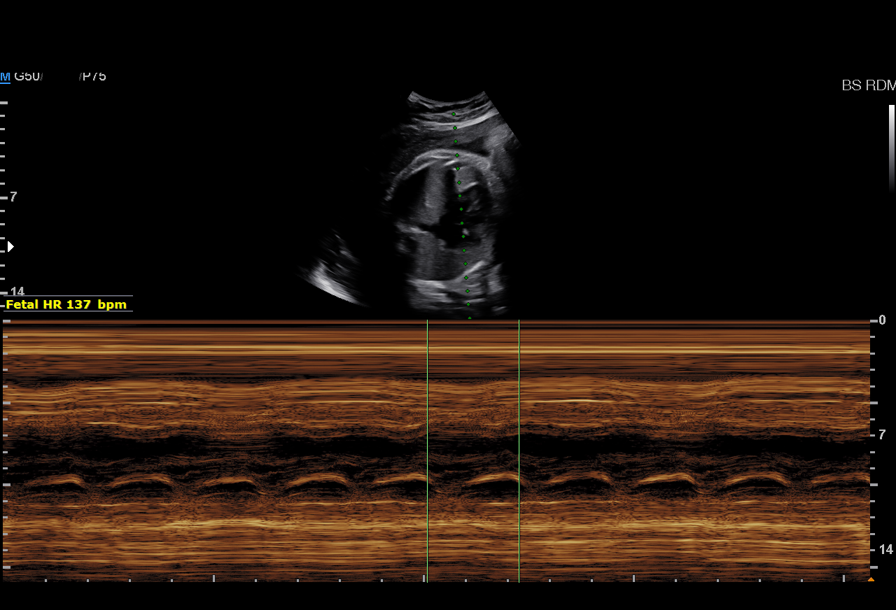
[im 4/10]
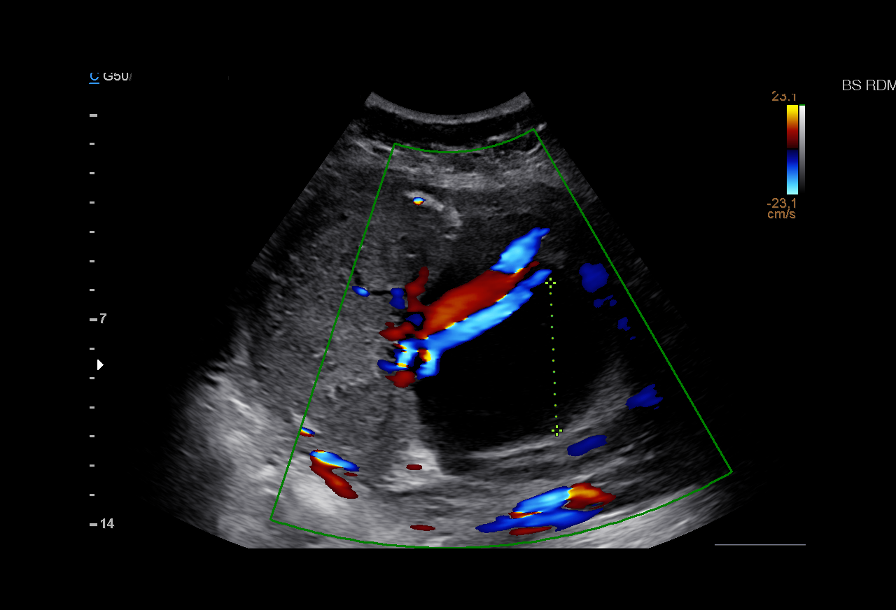
[im 5/10]
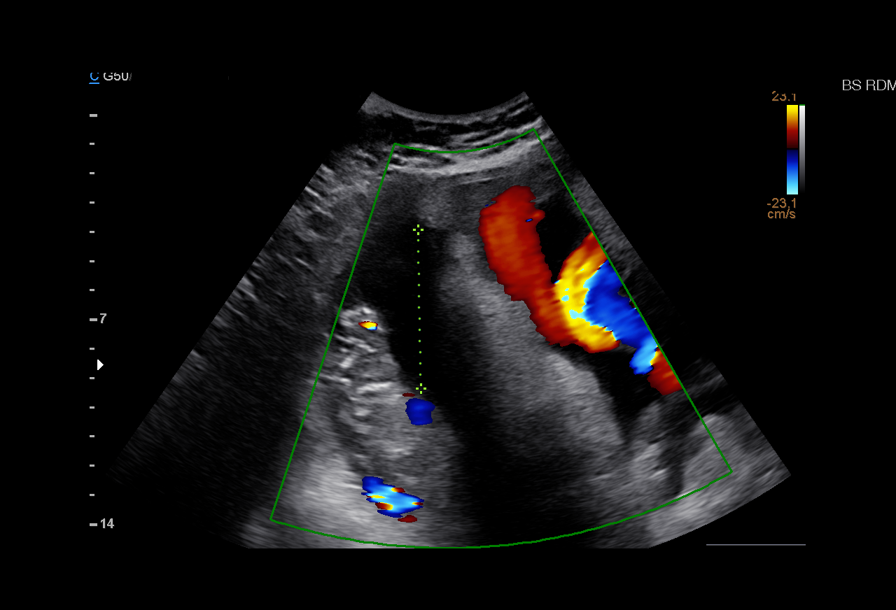
[im 6/10]
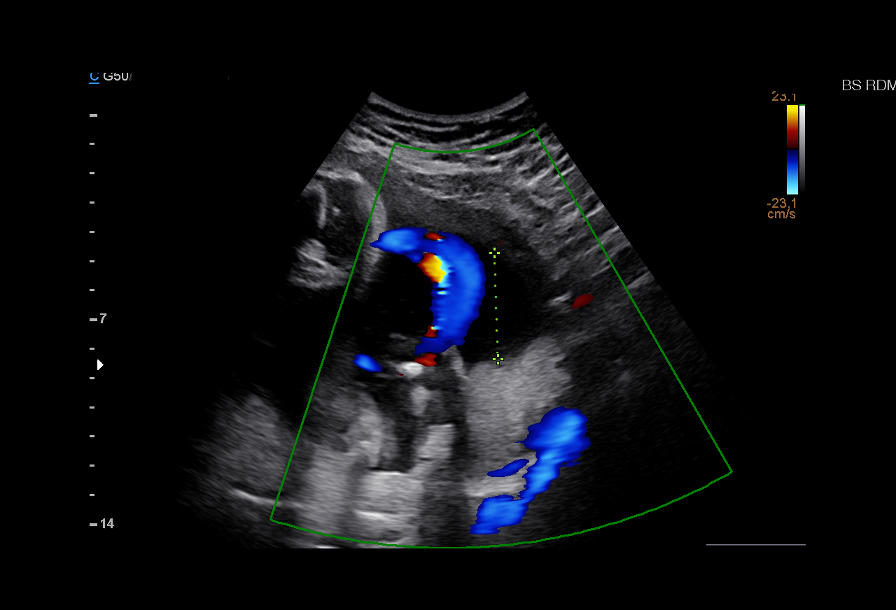
[im 7/10]
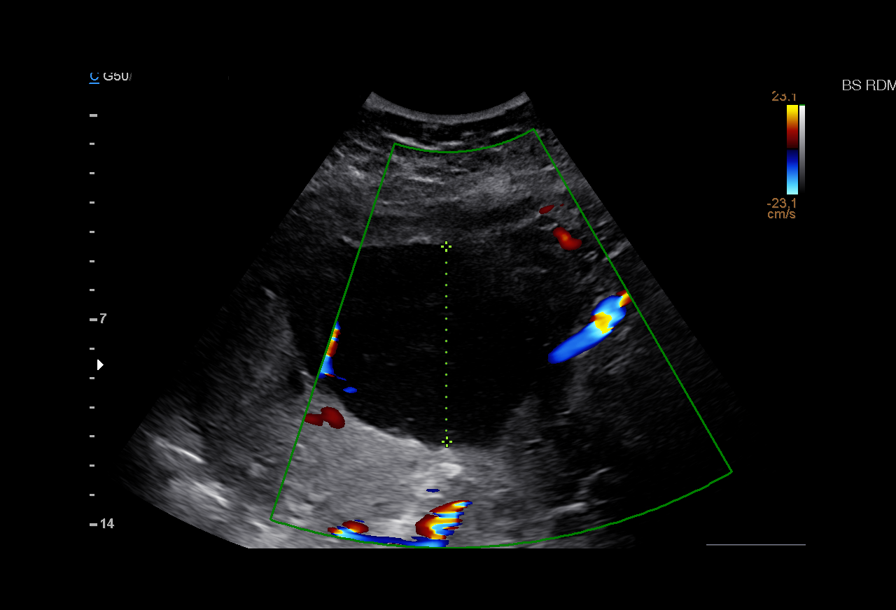
[im 8/10]
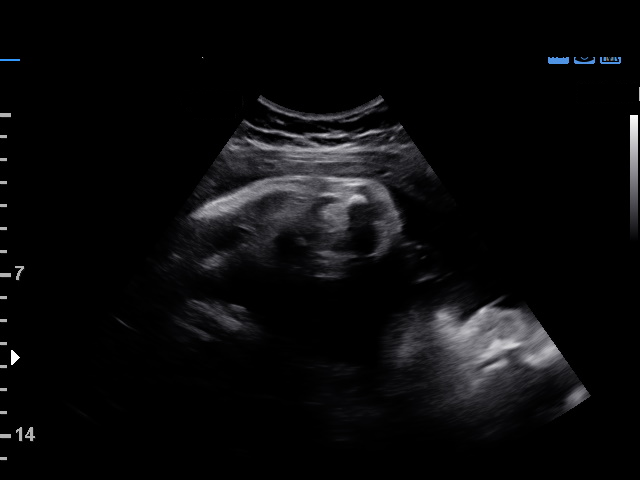
[im 9/10]
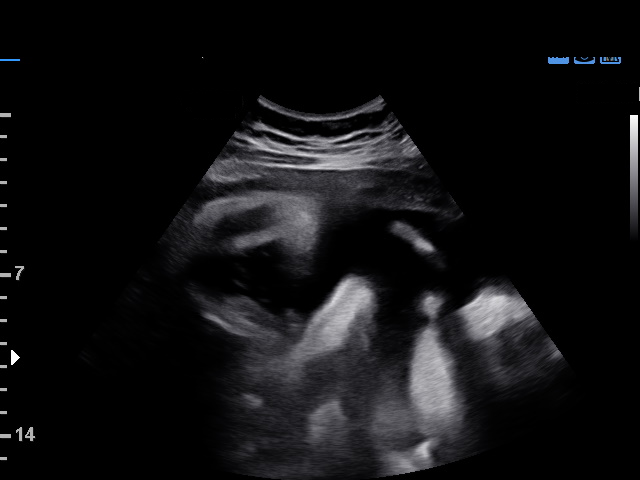
[im 10/10]
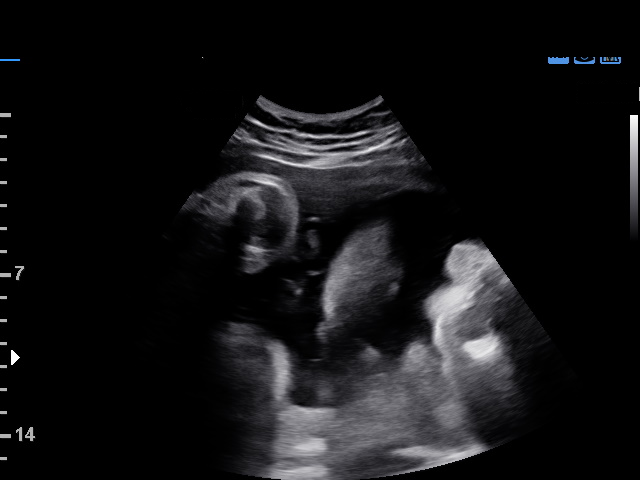

[10 of 10 positions shown; findings below may reference images not displayed]

MAU/Triage

1  BLAIN JUMPER             [id number]      [id number]     [id number]
Indications

Non-reactive NST, FHR decelerations
35 weeks gestation of pregnancy
Advanced maternal age multigravida 34,
third trimester
Poor obstetric history: Previous preterm
delivery (22, 35, 36 weeks)
Insufficient Prenatal Care
OB History

Gravidity:    12        Term:   4        Prem:   5         SAB:   2
Living:       8
Fetal Evaluation

Num Of Fetuses:     1
Fetal Heart         137
Rate(bpm):
Cardiac Activity:   Observed
Presentation:       Cephalic

Amniotic Fluid
AFI FV:      Subjectively upper-normal
AFI Sum:     20.79    cm      78  %Tile      Larg Pckt:   6.67  cm
RUQ:   5.05    cm   RLQ:    6.67   cm    LUQ:   5.43    cm    LLQ:   3.64    cm
Biophysical Evaluation

Amniotic F.V:   Pocket => 2 cm two         F. Tone:         Observed
planes
F. Movement:    Observed                   Score:           [DATE]
F. Breathing:   Observed
Gestational Age

LMP:           35w 5d        Date:  03/05/15                 EDD:    12/10/15
Best:          35w 5d     Det. By:  LMP  (03/05/15)          EDD:    12/10/15
Impression

SIUP at 35+5 weeks
Cepahlic presentation
Normal amniotic fluid volume
BPP [DATE]
Recommendations

Follow-up as clinically indicated

## 2018-08-31 ENCOUNTER — Emergency Department: Payer: Medicaid Other

## 2018-08-31 ENCOUNTER — Emergency Department
Admission: EM | Admit: 2018-08-31 | Discharge: 2018-09-01 | Disposition: A | Discharge status: Home / Self Care | Payer: Medicaid Other | Attending: Emergency Medicine | Admitting: Emergency Medicine

## 2018-08-31 ENCOUNTER — Other Ambulatory Visit: Payer: Self-pay

## 2018-08-31 DIAGNOSIS — O219 Vomiting of pregnancy, unspecified: Secondary | ICD-10-CM | POA: Diagnosis present

## 2018-08-31 DIAGNOSIS — Z3A09 9 weeks gestation of pregnancy: Secondary | ICD-10-CM | POA: Insufficient documentation

## 2018-08-31 DIAGNOSIS — Z79899 Other long term (current) drug therapy: Secondary | ICD-10-CM | POA: Insufficient documentation

## 2018-08-31 DIAGNOSIS — Z87891 Personal history of nicotine dependence: Secondary | ICD-10-CM | POA: Diagnosis not present

## 2018-08-31 DIAGNOSIS — J111 Influenza due to unidentified influenza virus with other respiratory manifestations: Secondary | ICD-10-CM | POA: Diagnosis not present

## 2018-08-31 DIAGNOSIS — R1030 Lower abdominal pain, unspecified: Secondary | ICD-10-CM

## 2018-08-31 DIAGNOSIS — O98519 Other viral diseases complicating pregnancy, unspecified trimester: Secondary | ICD-10-CM | POA: Diagnosis not present

## 2018-08-31 LAB — BASIC METABOLIC PANEL
Anion gap: 8 (ref 5–15)
BUN: 9 mg/dL (ref 6–20)
CO2: 19 mmol/L — ABNORMAL LOW (ref 22–32)
Calcium: 8.8 mg/dL — ABNORMAL LOW (ref 8.9–10.3)
Chloride: 107 mmol/L (ref 98–111)
Creatinine, Ser: 0.45 mg/dL (ref 0.44–1.00)
GFR calc Af Amer: >60 mL/min (ref >60)
GFR calc non Af Amer: >60 mL/min (ref >60)
Glucose, Bld: 95 mg/dL (ref 70–99)
Potassium: 3.3 mmol/L — ABNORMAL LOW (ref 3.5–5.1)
Sodium: 134 mmol/L — ABNORMAL LOW (ref 135–145)

## 2018-08-31 LAB — URINALYSIS, COMPLETE (UACMP) WITH MICROSCOPIC
Bacteria, UA: NONE SEEN
Bilirubin Urine: NEGATIVE
Glucose, UA: NEGATIVE mg/dL
Hgb urine dipstick: NEGATIVE
Ketones, ur: 80 mg/dL — AB
Leukocytes, UA: NEGATIVE
Nitrite: NEGATIVE
Protein, ur: 30 mg/dL — AB
Specific Gravity, Urine: 1.027 (ref 1.005–1.030)
pH: 5 (ref 5.0–8.0)

## 2018-08-31 LAB — CBC
HCT: 36.5 % (ref 36.0–46.0)
Hemoglobin: 12.1 g/dL (ref 12.0–15.0)
MCH: 30.1 pg (ref 26.0–34.0)
MCHC: 33.2 g/dL (ref 30.0–36.0)
MCV: 90.8 fL (ref 80.0–100.0)
Platelets: 290 10*3/uL (ref 150–400)
RBC: 4.02 MIL/uL (ref 3.87–5.11)
RDW: 13.9 % (ref 11.5–15.5)
WBC: 7.3 10*3/uL (ref 4.0–10.5)
nRBC: 0 % (ref 0.0–0.2)

## 2018-08-31 LAB — POCT PREGNANCY, URINE: Preg Test, Ur: POSITIVE — AB

## 2018-08-31 LAB — HCG, QUANTITATIVE, PREGNANCY: hCG, Beta Chain, Quant, S: 97962 m[IU]/mL — ABNORMAL HIGH (ref <5)

## 2018-08-31 MED ORDER — DIPHENHYDRAMINE HCL 50 MG/ML IJ SOLN
25.0000 mg | Freq: Once | INTRAMUSCULAR | Status: AC
  Administered 2018-08-31: 25 mg via INTRAVENOUS
  Filled 2018-08-31: qty 1

## 2018-08-31 MED ORDER — METOCLOPRAMIDE HCL 5 MG/ML IJ SOLN
10.0000 mg | Freq: Once | INTRAMUSCULAR | Status: AC
  Administered 2018-08-31: 10 mg via INTRAVENOUS
  Filled 2018-08-31: qty 2

## 2018-08-31 MED ORDER — ACETAMINOPHEN 500 MG PO TABS
ORAL_TABLET | ORAL | Status: AC
  Filled 2018-08-31: qty 2

## 2018-08-31 MED ORDER — ACETAMINOPHEN 500 MG PO TABS
1000.0000 mg | ORAL_TABLET | Freq: Once | ORAL | Status: AC
  Administered 2018-08-31: 1000 mg via ORAL

## 2018-08-31 MED ORDER — DEXTROSE-NACL 5-0.9 % IV SOLN
1000.0000 mL | Freq: Once | INTRAVENOUS | Status: AC
  Administered 2018-09-01: 1000 mL via INTRAVENOUS

## 2018-08-31 MED ORDER — DEXTROSE-NACL 5-0.45 % IV SOLN
Freq: Once | INTRAVENOUS | Status: AC
  Administered 2018-08-31: 23:00:00 via INTRAVENOUS

## 2018-08-31 NOTE — ED Notes (Signed)
Pt states she can't have her flu test done now because she is having severe hot flashes. Pt asking to wait for test to be done. Thermostat lowered per pt request.
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
## 2018-08-31 NOTE — ED Notes (Signed)
Pt also states pain when urinating

## 2018-08-31 NOTE — ED Notes (Addendum)
Able to hear FHT but unable to get accurate HR count. Pt states she can feel baby moving at this time.

## 2018-09-01 LAB — INFLUENZA PANEL BY PCR (TYPE A & B)
Influenza A By PCR: POSITIVE — AB
Influenza B By PCR: NEGATIVE

## 2018-09-01 MED ORDER — METOCLOPRAMIDE HCL 10 MG PO TABS
10.0000 mg | ORAL_TABLET | Freq: Once | ORAL | Status: AC
  Administered 2018-09-01: 10 mg via ORAL
  Filled 2018-09-01: qty 1

## 2018-09-01 MED ORDER — METOCLOPRAMIDE HCL 5 MG/ML IJ SOLN
10.0000 mg | Freq: Once | INTRAMUSCULAR | Status: AC
  Administered 2018-09-01: 10 mg via INTRAVENOUS
  Filled 2018-09-01: qty 2

## 2018-09-01 MED ORDER — OSELTAMIVIR PHOSPHATE 75 MG PO CAPS: 75.0000 mg | ORAL_CAPSULE | Freq: Two times a day (BID) | ORAL | 0 refills | Status: DC

## 2018-09-01 MED ORDER — ACETAMINOPHEN 325 MG PO TABS
650.0000 mg | ORAL_TABLET | Freq: Once | ORAL | Status: AC
  Administered 2018-09-01: 650 mg via ORAL
  Filled 2018-09-01: qty 2

## 2018-09-01 MED ORDER — OSELTAMIVIR PHOSPHATE 75 MG PO CAPS: 75.0000 mg | ORAL_CAPSULE | Freq: Two times a day (BID) | ORAL | 0 refills | Status: AC

## 2018-09-01 MED ORDER — METOCLOPRAMIDE HCL 10 MG PO TABS: 10.0000 mg | ORAL_TABLET | Freq: Four times a day (QID) | ORAL | 0 refills | Status: AC | PRN

## 2018-09-01 MED ORDER — ACETAMINOPHEN 500 MG PO TABS: 1000.0000 mg | ORAL_TABLET | Freq: Four times a day (QID) | ORAL | 0 refills | Status: AC | PRN

## 2018-09-01 MED ORDER — ACETAMINOPHEN 500 MG PO TABS: 1000.0000 mg | ORAL_TABLET | Freq: Four times a day (QID) | ORAL | 0 refills | Status: DC | PRN

## 2018-09-01 MED ORDER — OSELTAMIVIR PHOSPHATE 75 MG PO CAPS
75.0000 mg | ORAL_CAPSULE | Freq: Once | ORAL | Status: AC
  Administered 2018-09-01: 75 mg via ORAL
  Filled 2018-09-01: qty 1
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
## 2018-09-01 NOTE — Care Management (Signed)
Post ED discharge note entry: Jenna ReapBetty Harding from Medication Management called RNCM stating that [facility] provided medications to medication management for this patient. They could not assist her and patient didn't have money for co-pays.  She has Medicaid therefore patient could not get medications filled by med management.
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
## 2018-09-01 NOTE — ED Notes (Signed)
Pt given breakfast tray by RN. Fluids currently not flowing through IV. Attempted to flush IV, but unable to flush at this. Pt received around of d5 NS. MD states to discontinue other portion of fluids at this time

## 2018-09-01 NOTE — ED Provider Notes (Signed)
-----------------------------------------   9:57 AM on 09/01/2018 -----------------------------------------  Social work has seen the patient.  We have provided a taxicab voucher we will get the patient to medication management to help with her prescriptions and then back [facility] the [facility] shelter.  I have seen the patient personally.  We have fed the patient she has tolerated p.o. well.  We will discharge with a nausea medication as well as a prescription for Tylenol, Reglan, Tamiflu.  Patient agreeable to plan of care.   Minna AntisPaduchowski, Mariadelaluz [place], MD 09/01/18 [phone]

## 2020-10-24 IMAGING — US US OB TRANSVAGINAL
1 series · 14 of 28 positions shown · non-contrast
Comparison: None.

CLINICAL DATA: Abdominal pain.  Declining beta HCG.

EXAM:
OBSTETRIC <14 WK US AND TRANSVAGINAL OB US
TECHNIQUE: Both transabdominal and transvaginal ultrasound examinations were
performed for complete evaluation of the gestation as well as the
maternal uterus, adnexal regions, and pelvic cul-de-sac.
Transvaginal technique was performed to assess early pregnancy.

[Series 1: us ob transvaginal · 0.23mm/px · 14 of 90 slices shown]
[im 4/90]
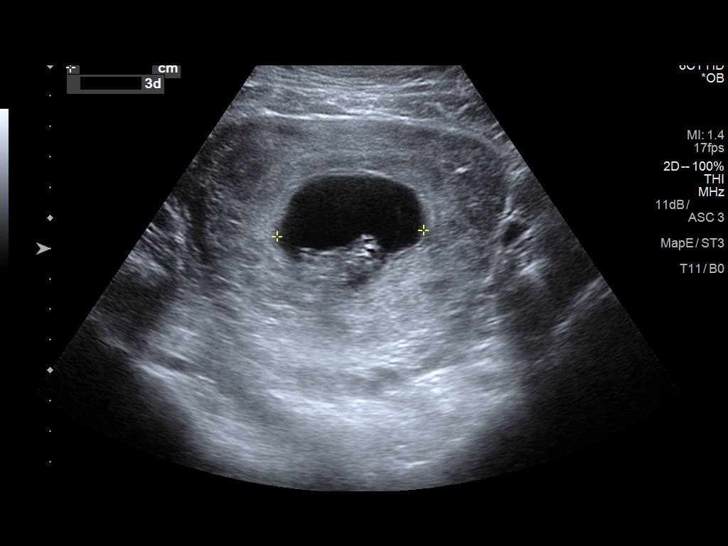
[im 10/90]
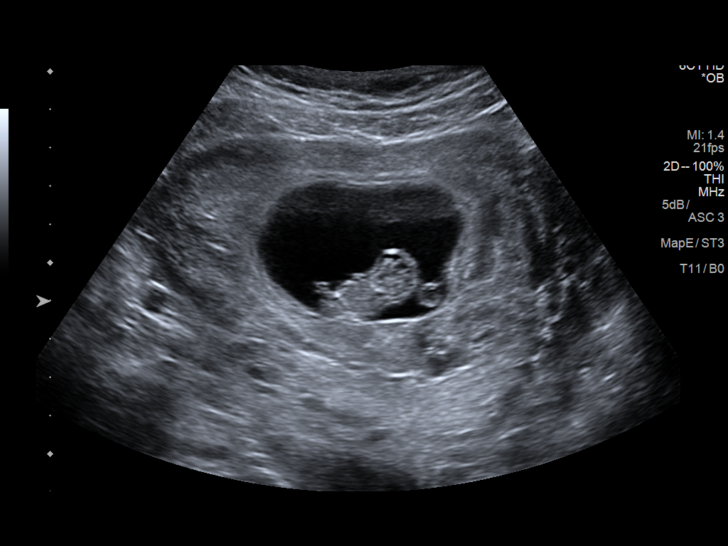
[im 17/90]
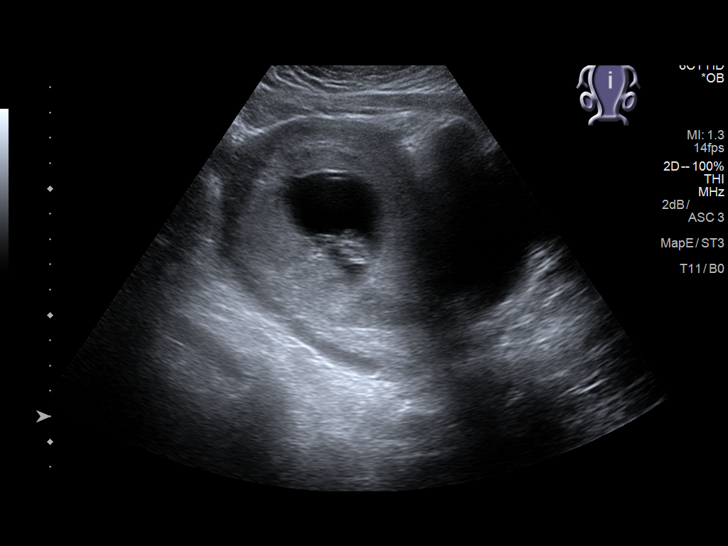
[im 24/90]
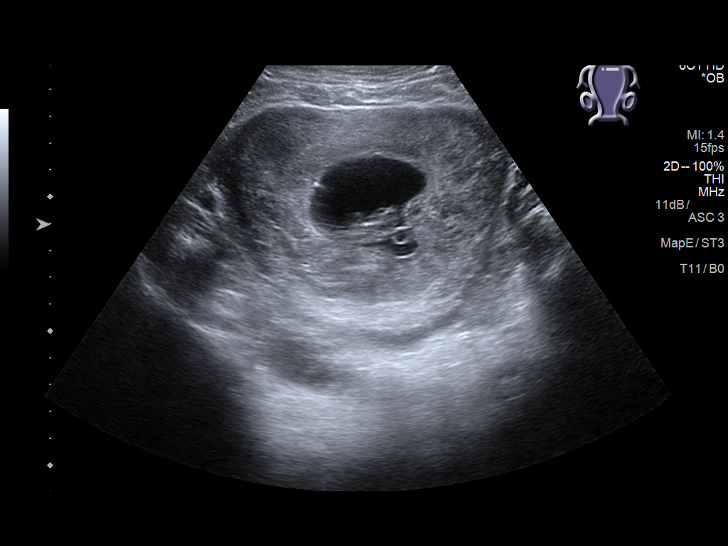
[im 30/90]
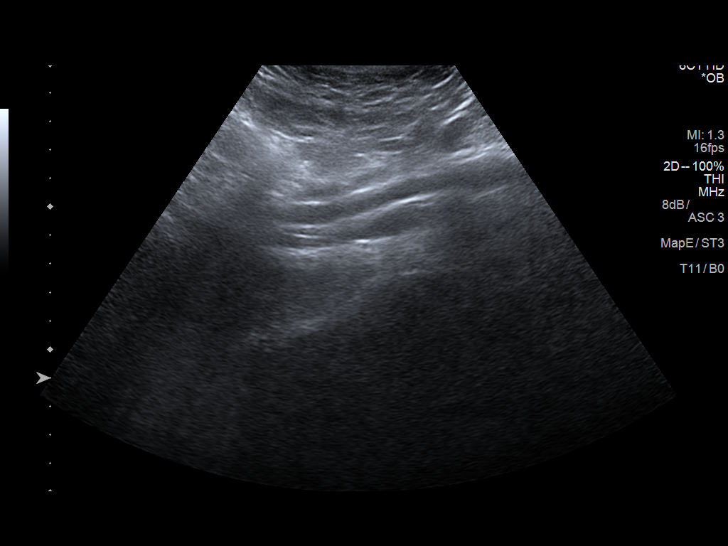
[im 37/90]
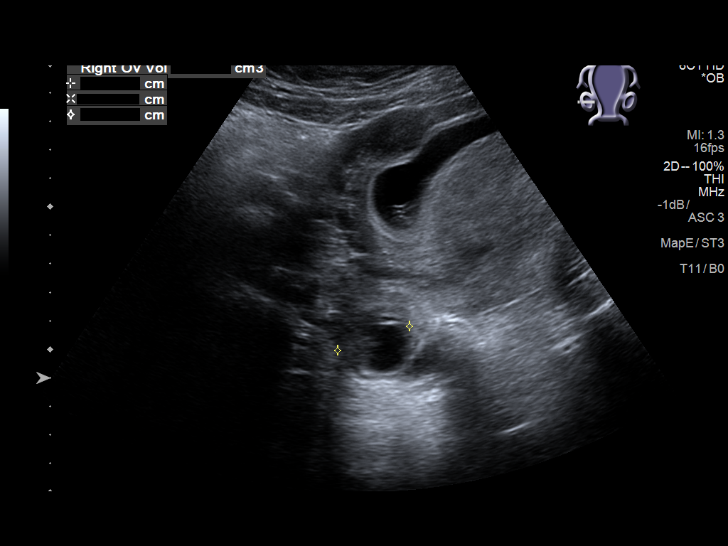
[im 43/90]
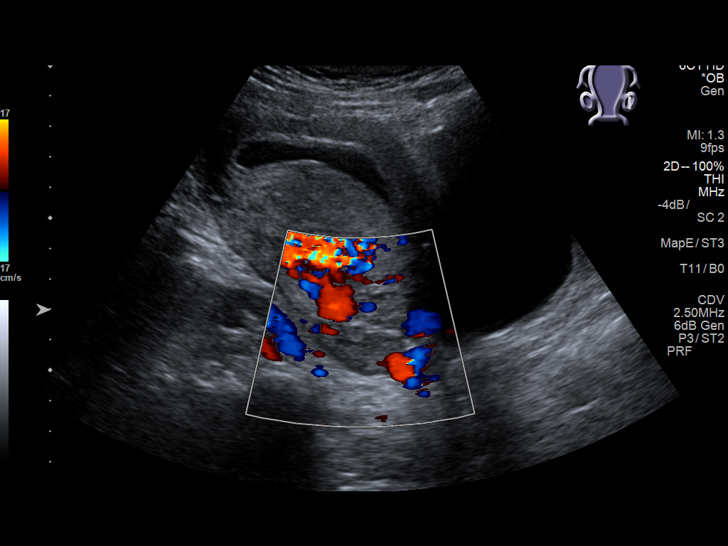
[im 50/90]
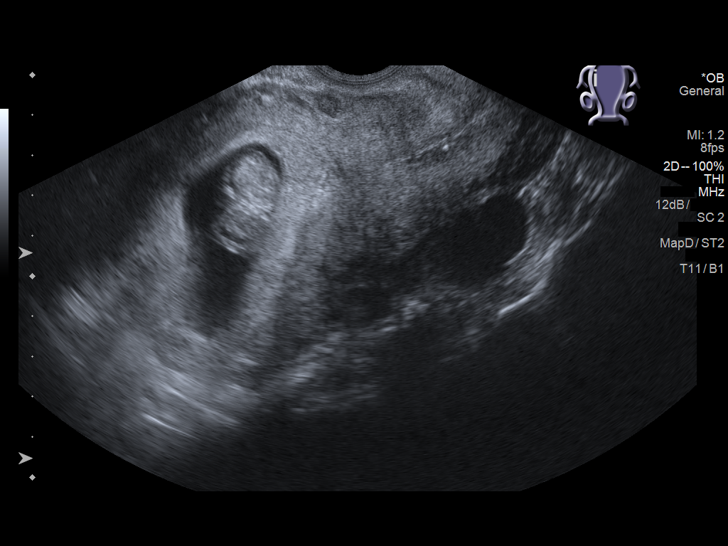
[im 57/90]
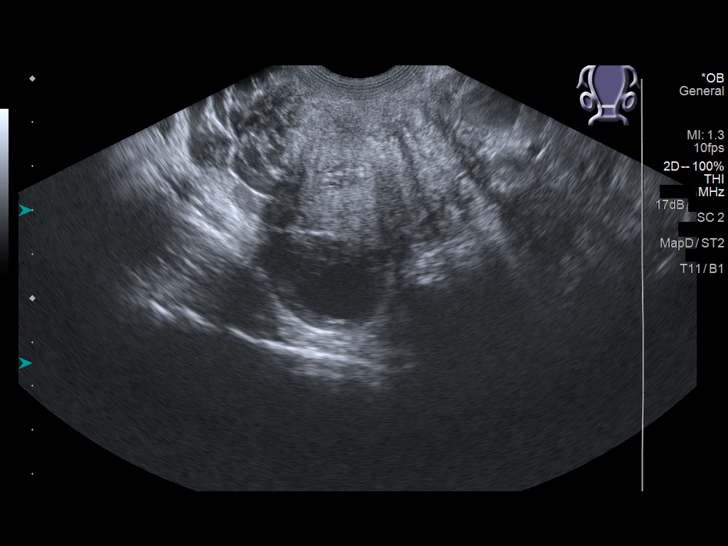
[im 63/90]
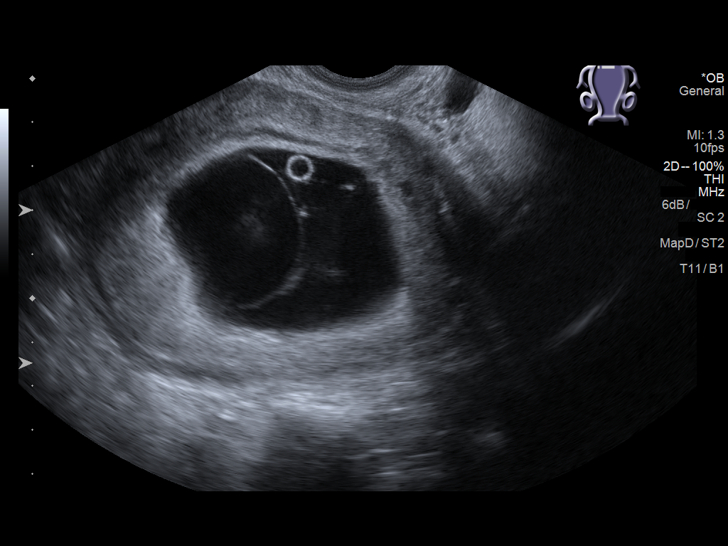
[im 70/90]
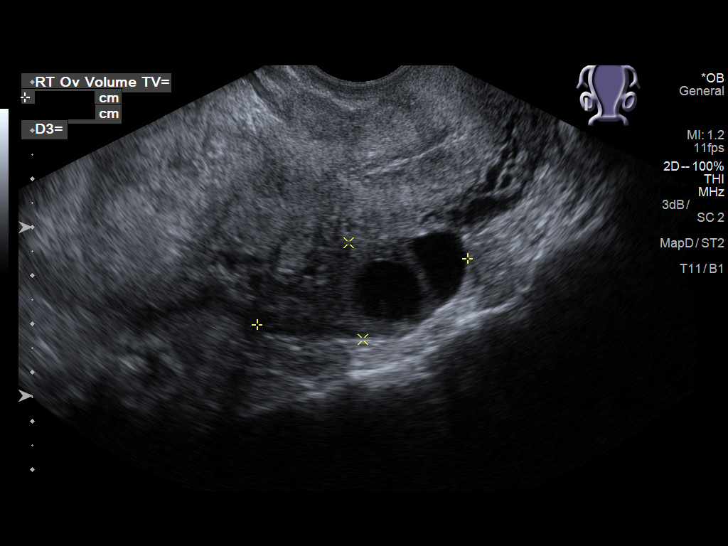
[im 76/90]
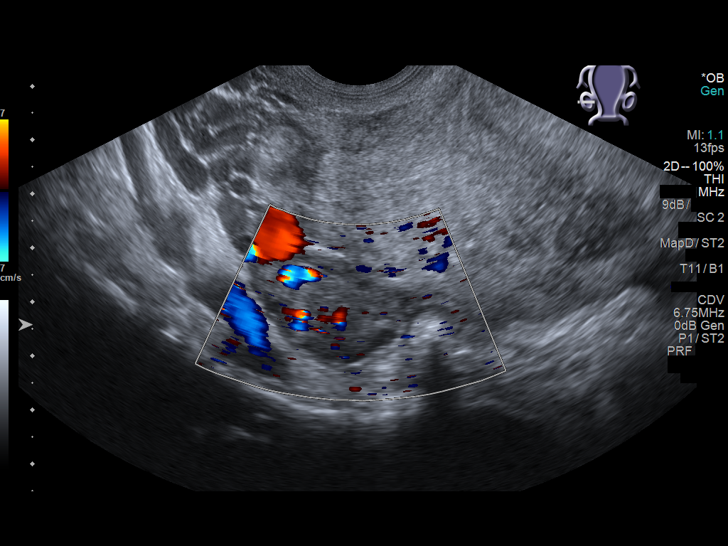
[im 83/90]
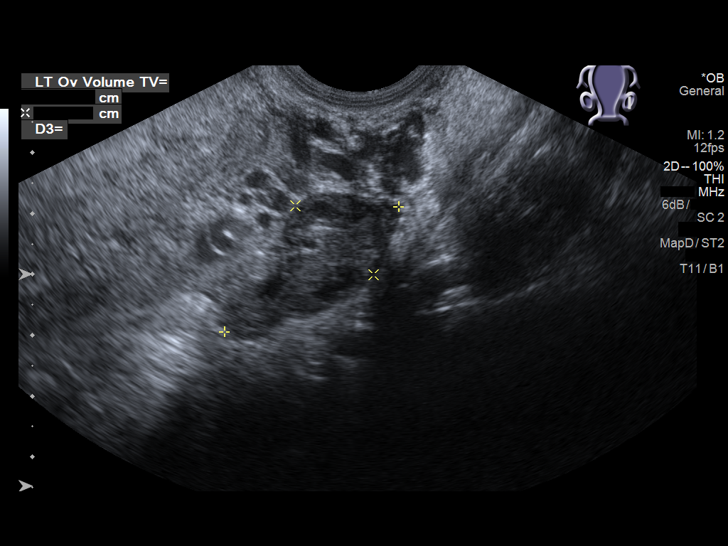
[im 90/90]
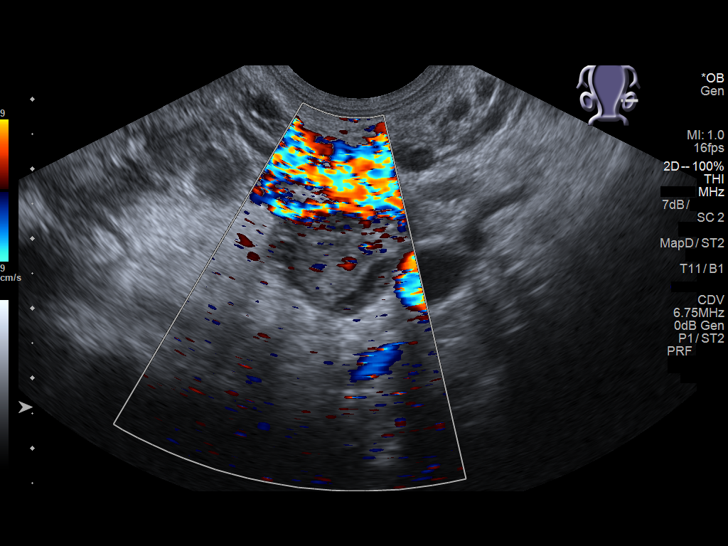

[14 of 28 positions shown; findings below may reference images not displayed]

FINDINGS: Intrauterine gestational sac: Present.

Yolk sac:  Present.

Embryo:  Present.

Cardiac Activity: Present.

Heart Rate: 169 bpm

CRL: 27.7 mm   9 w   4 d                  US EDC: April 01, 2019

Subchorionic hemorrhage:  None visualized.

Maternal uterus/adnexae: Normal appearance of the adnexa. 2 cm
hypoechoic RIGHT uterine intramural leiomyoma. Trace free fluid.
IMPRESSION: 1. Single live uterine pregnancy, gestational age by ultrasound 9
weeks and 4 days. No immediate complication.
